# Patient Record
Sex: Female | Born: 1950 | Race: White | Hispanic: No | Marital: Married | Smoking: Never smoker
Health system: Southern US, Community
[De-identification: ages and names within clinical notes are randomized; demographics above are authoritative.]

## PROBLEM LIST (undated history)

## (undated) DIAGNOSIS — I2510 Atherosclerotic heart disease of native coronary artery without angina pectoris: Secondary | ICD-10-CM

## (undated) DIAGNOSIS — E785 Hyperlipidemia, unspecified: Secondary | ICD-10-CM

## (undated) DIAGNOSIS — I10 Essential (primary) hypertension: Secondary | ICD-10-CM

## (undated) DIAGNOSIS — R739 Hyperglycemia, unspecified: Secondary | ICD-10-CM

## (undated) DIAGNOSIS — K5792 Diverticulitis of intestine, part unspecified, without perforation or abscess without bleeding: Secondary | ICD-10-CM

## (undated) DIAGNOSIS — C50911 Malignant neoplasm of unspecified site of right female breast: Secondary | ICD-10-CM

## (undated) DIAGNOSIS — I214 Non-ST elevation (NSTEMI) myocardial infarction: Secondary | ICD-10-CM

## (undated) HISTORY — PX: LIVER BIOPSY: SHX301

## (undated) HISTORY — DX: Atherosclerotic heart disease of native coronary artery without angina pectoris: I25.10

## (undated) HISTORY — DX: Non-ST elevation (NSTEMI) myocardial infarction: I21.4

## (undated) HISTORY — PX: TONSILLECTOMY: SUR1361

---

## 1988-02-14 HISTORY — PX: BREAST BIOPSY: SHX20

## 1988-02-14 HISTORY — PX: BREAST RECONSTRUCTION: SHX9

## 1990-02-13 DIAGNOSIS — C50911 Malignant neoplasm of unspecified site of right female breast: Secondary | ICD-10-CM

## 1990-02-13 HISTORY — PX: RECONSTRUCTION BREAST W/ TRAM FLAP: SUR1079

## 1990-02-13 HISTORY — DX: Malignant neoplasm of unspecified site of right female breast: C50.911

## 1990-02-13 HISTORY — PX: MASTECTOMY: SHX3

## 2003-02-14 HISTORY — PX: TOTAL ABDOMINAL HYSTERECTOMY: SHX209

## 2012-12-14 DIAGNOSIS — I214 Non-ST elevation (NSTEMI) myocardial infarction: Secondary | ICD-10-CM

## 2012-12-14 HISTORY — DX: Non-ST elevation (NSTEMI) myocardial infarction: I21.4

## 2013-01-12 ENCOUNTER — Emergency Department (HOSPITAL_COMMUNITY): Payer: 59

## 2013-01-12 ENCOUNTER — Encounter (HOSPITAL_COMMUNITY)
Admission: EM | Disposition: A | Discharge status: Home / Self Care | Payer: Self-pay | Source: Home / Self Care | Attending: Cardiovascular Disease

## 2013-01-12 ENCOUNTER — Encounter (HOSPITAL_COMMUNITY): Payer: Self-pay | Admitting: Emergency Medicine

## 2013-01-12 ENCOUNTER — Inpatient Hospital Stay (HOSPITAL_COMMUNITY)
Admission: EM | Admit: 2013-01-12 | Discharge: 2013-01-15 | DRG: 247 | Disposition: A | Discharge status: Home / Self Care | Payer: 59 | Attending: Cardiovascular Disease | Admitting: Cardiovascular Disease

## 2013-01-12 DIAGNOSIS — E785 Hyperlipidemia, unspecified: Secondary | ICD-10-CM

## 2013-01-12 DIAGNOSIS — R7309 Other abnormal glucose: Secondary | ICD-10-CM | POA: Diagnosis present

## 2013-01-12 DIAGNOSIS — I129 Hypertensive chronic kidney disease with stage 1 through stage 4 chronic kidney disease, or unspecified chronic kidney disease: Secondary | ICD-10-CM | POA: Diagnosis present

## 2013-01-12 DIAGNOSIS — I2119 ST elevation (STEMI) myocardial infarction involving other coronary artery of inferior wall: Secondary | ICD-10-CM

## 2013-01-12 DIAGNOSIS — I249 Acute ischemic heart disease, unspecified: Secondary | ICD-10-CM

## 2013-01-12 DIAGNOSIS — Z955 Presence of coronary angioplasty implant and graft: Secondary | ICD-10-CM

## 2013-01-12 DIAGNOSIS — I2510 Atherosclerotic heart disease of native coronary artery without angina pectoris: Secondary | ICD-10-CM

## 2013-01-12 DIAGNOSIS — Z79899 Other long term (current) drug therapy: Secondary | ICD-10-CM

## 2013-01-12 DIAGNOSIS — I200 Unstable angina: Secondary | ICD-10-CM

## 2013-01-12 DIAGNOSIS — N189 Chronic kidney disease, unspecified: Secondary | ICD-10-CM | POA: Diagnosis present

## 2013-01-12 DIAGNOSIS — Z853 Personal history of malignant neoplasm of breast: Secondary | ICD-10-CM

## 2013-01-12 DIAGNOSIS — I214 Non-ST elevation (NSTEMI) myocardial infarction: Principal | ICD-10-CM

## 2013-01-12 HISTORY — DX: Essential (primary) hypertension: I10

## 2013-01-12 HISTORY — PX: LEFT HEART CATH: SHX5478

## 2013-01-12 HISTORY — PX: CORONARY ANGIOPLASTY WITH STENT PLACEMENT: SHX49

## 2013-01-12 HISTORY — PX: PERCUTANEOUS CORONARY STENT INTERVENTION (PCI-S): SHX5485

## 2013-01-12 HISTORY — DX: Hyperlipidemia, unspecified: E78.5

## 2013-01-12 LAB — PROTIME-INR: Prothrombin Time: 12.1 seconds (ref 11.6–15.2)

## 2013-01-12 LAB — HEPARIN LEVEL (UNFRACTIONATED): Heparin Unfractionated: <0.1 IU/mL — ABNORMAL LOW (ref 0.30–0.70)

## 2013-01-12 LAB — CBC
HCT: 45 % (ref 36.0–46.0)
Hemoglobin: 15.6 g/dL — ABNORMAL HIGH (ref 12.0–15.0)
MCHC: 34.7 g/dL (ref 30.0–36.0)
MCV: 90.4 fL (ref 78.0–100.0)
RBC: 4.98 MIL/uL (ref 3.87–5.11)
RDW: 12.3 % (ref 11.5–15.5)
WBC: 10.8 10*3/uL — ABNORMAL HIGH (ref 4.0–10.5)

## 2013-01-12 LAB — POCT I-STAT, CHEM 8
Glucose, Bld: 132 mg/dL — ABNORMAL HIGH (ref 70–99)
Hemoglobin: 16 g/dL — ABNORMAL HIGH (ref 12.0–15.0)
Sodium: 141 mEq/L (ref 135–145)
TCO2: 24 mmol/L (ref 0–100)

## 2013-01-12 LAB — APTT: aPTT: 35 seconds (ref 24–37)

## 2013-01-12 LAB — POCT I-STAT TROPONIN I: Troponin i, poc: 11.01 ng/mL (ref 0.00–0.08)

## 2013-01-12 LAB — TROPONIN I: Troponin I: 13.05 ng/mL (ref <0.30)

## 2013-01-12 SURGERY — LEFT HEART CATH
Anesthesia: LOCAL

## 2013-01-12 MED ORDER — ONDANSETRON HCL 4 MG/2ML IJ SOLN
4.0000 mg | Freq: Four times a day (QID) | INTRAMUSCULAR | Status: DC | PRN
  Administered 2013-01-12: 4 mg via INTRAVENOUS
  Filled 2013-01-12: qty 2

## 2013-01-12 MED ORDER — SODIUM CHLORIDE 0.9 % IV SOLN
INTRAVENOUS | Status: AC
  Administered 2013-01-12: 100 mL/h via INTRAVENOUS

## 2013-01-12 MED ORDER — ATORVASTATIN CALCIUM 80 MG PO TABS
80.0000 mg | ORAL_TABLET | Freq: Every day | ORAL | Status: DC
  Filled 2013-01-12 (×4): qty 1

## 2013-01-12 MED ORDER — NITROGLYCERIN 0.2 MG/ML ON CALL CATH LAB
INTRAVENOUS | Status: AC
  Filled 2013-01-12: qty 1

## 2013-01-12 MED ORDER — METOPROLOL TARTRATE 12.5 MG HALF TABLET
25.0000 mg | ORAL_TABLET | Freq: Two times a day (BID) | ORAL | Status: DC
  Administered 2013-01-12: 25 mg via ORAL
  Filled 2013-01-12: qty 2

## 2013-01-12 MED ORDER — HEPARIN (PORCINE) IN NACL 2-0.9 UNIT/ML-% IJ SOLN
INTRAMUSCULAR | Status: AC
  Filled 2013-01-12: qty 1000

## 2013-01-12 MED ORDER — METOPROLOL TARTRATE 25 MG PO TABS
25.0000 mg | ORAL_TABLET | Freq: Two times a day (BID) | ORAL | Status: DC
  Administered 2013-01-12 – 2013-01-15 (×6): 25 mg via ORAL
  Filled 2013-01-12 (×6): qty 1

## 2013-01-12 MED ORDER — MIDAZOLAM HCL 2 MG/2ML IJ SOLN
INTRAMUSCULAR | Status: AC
  Filled 2013-01-12: qty 2

## 2013-01-12 MED ORDER — TICAGRELOR 90 MG PO TABS
90.0000 mg | ORAL_TABLET | Freq: Two times a day (BID) | ORAL | Status: DC
  Administered 2013-01-12 – 2013-01-15 (×6): 90 mg via ORAL
  Filled 2013-01-12 (×7): qty 1

## 2013-01-12 MED ORDER — NITROGLYCERIN 0.4 MG SL SUBL: 0.4000 mg | SUBLINGUAL_TABLET | SUBLINGUAL | Status: DC | PRN

## 2013-01-12 MED ORDER — TICAGRELOR 90 MG PO TABS
ORAL_TABLET | ORAL | Status: AC
  Filled 2013-01-12: qty 1

## 2013-01-12 MED ORDER — FENTANYL CITRATE 0.05 MG/ML IJ SOLN
INTRAMUSCULAR | Status: AC
  Filled 2013-01-12: qty 2

## 2013-01-12 MED ORDER — HEPARIN (PORCINE) IN NACL 100-0.45 UNIT/ML-% IJ SOLN
800.0000 [IU]/h | INTRAMUSCULAR | Status: DC
  Administered 2013-01-12: 800 [IU]/h via INTRAVENOUS
  Filled 2013-01-12: qty 250

## 2013-01-12 MED ORDER — HEPARIN SODIUM (PORCINE) 5000 UNIT/ML IJ SOLN: 60.0000 [IU]/kg | Freq: Once | INTRAMUSCULAR | Status: DC

## 2013-01-12 MED ORDER — LIDOCAINE HCL (PF) 1 % IJ SOLN
INTRAMUSCULAR | Status: AC
  Filled 2013-01-12: qty 30

## 2013-01-12 MED ORDER — VERAPAMIL HCL 2.5 MG/ML IV SOLN
INTRAVENOUS | Status: AC
  Filled 2013-01-12: qty 2

## 2013-01-12 MED ORDER — SODIUM CHLORIDE 0.9 % IV SOLN
0.2500 mg/kg/h | INTRAVENOUS | Status: AC
  Filled 2013-01-12 (×3): qty 250

## 2013-01-12 MED ORDER — HEPARIN (PORCINE) IN NACL 2-0.9 UNIT/ML-% IJ SOLN
INTRAMUSCULAR | Status: AC
  Filled 2013-01-12: qty 500

## 2013-01-12 MED ORDER — ACETAMINOPHEN 325 MG PO TABS
650.0000 mg | ORAL_TABLET | ORAL | Status: DC | PRN
  Administered 2013-01-13: 650 mg via ORAL
  Filled 2013-01-12: qty 2

## 2013-01-12 MED ORDER — ASPIRIN 81 MG PO CHEW
324.0000 mg | CHEWABLE_TABLET | Freq: Once | ORAL | Status: AC
  Administered 2013-01-12: 324 mg via ORAL
  Filled 2013-01-12: qty 4

## 2013-01-12 MED ORDER — BIVALIRUDIN 250 MG IV SOLR
INTRAVENOUS | Status: AC
  Filled 2013-01-12: qty 250

## 2013-01-12 MED ORDER — HEPARIN BOLUS VIA INFUSION
4000.0000 [IU] | Freq: Once | INTRAVENOUS | Status: AC
  Administered 2013-01-12: 4000 [IU] via INTRAVENOUS
  Filled 2013-01-12: qty 4000

## 2013-01-12 MED ORDER — ASPIRIN 81 MG PO CHEW
81.0000 mg | CHEWABLE_TABLET | Freq: Every day | ORAL | Status: DC
  Administered 2013-01-13 – 2013-01-15 (×3): 81 mg via ORAL
  Filled 2013-01-12 (×3): qty 1

## 2013-01-12 MED ORDER — HEPARIN (PORCINE) IN NACL 100-0.45 UNIT/ML-% IJ SOLN: 12.0000 [IU]/kg/h | INTRAMUSCULAR | Status: DC

## 2013-01-12 MED ORDER — PROMETHAZINE HCL 25 MG/ML IJ SOLN
12.5000 mg | Freq: Three times a day (TID) | INTRAMUSCULAR | Status: DC | PRN
  Administered 2013-01-12: 12.5 mg via INTRAVENOUS
  Filled 2013-01-12 (×2): qty 1

## 2013-01-12 MED ORDER — ASPIRIN 81 MG PO CHEW
CHEWABLE_TABLET | ORAL | Status: AC
  Filled 2013-01-12: qty 3

## 2013-01-12 NOTE — ED Notes (Signed)
Dr Radford Pax made aware of troponin of 9.87

## 2013-01-12 NOTE — Progress Notes (Signed)
Chaplain gave care to pt's husband while pt was in cath lab.  Chaplain offered gifts of compassion and exercised empathic listening and, also, provided a beverage from [facility].     01/12/13 1600  Clinical Encounter Type  Visited With Family  Visit Type Spiritual support    Rulon Abide [phone]

## 2013-01-12 NOTE — H&P (Signed)
Physician History and Physical         Patient ID: Ashley Conley MRN: [id number] DOB/AGE: 1951/02/10 62 y.o. Admit date: 01/12/2013  Primary Care Physician: No primary provider on file.  [facility] NP Primary Cardiologist:  New/Nishan  Active Problems:   * No active hospital problems. *   HPI:   62 yo with no cardiac history. 3 weeks of intermitant chest pressure and malainse.  Last night had severe episode with nausea and vohmiting  Thought it was reflux.  Came to ER this  A.m.  Called at 10:00 a.m by [facility] ED  Concern for acute ST elevaton MI with .5mm ST segment elevation in 3,F  Cath team called in by 10:15 am  She was given ASA and heparin bolus and  Drip Still with mild pressure.  No hemodynamic instability. No contraindications to anticoagulation.  CRF HTN on ARB  Nonsmoker nondiabetic  Tropoinin by time I left ER came back 11  Review of systems complete and found to be negative unless listed above   Past Medical History  Diagnosis Date  . Breast cancer     History reviewed. No pertinent family history.  History   Social History  . Marital Status: Married    Spouse Name: N/A    Number of Children: N/A  . Years of Education: N/A   Occupational History  . Not on file.   Social History Main Topics  . Smoking status: Never Smoker   . Smokeless tobacco: Not on file  . Alcohol Use: No  . Drug Use: No  . Sexual Activity: Not on file   Other Topics Concern  . Not on file   Social History Narrative  . No narrative on file    Past Surgical History  Procedure Laterality Date  . Abdominal hysterectomy    . Reconstruction breast w/ tram flap      1992     Prescriptions prior to admission  Medication Sig Dispense Refill  . Coenzyme Q10 (CO Q 10 PO) Take 1 capsule by mouth daily.      Marland Kitchen dicyclomine (BENTYL) 10 MG capsule Take 10 mg by mouth daily.      Marland Kitchen losartan (COZAAR) 25 MG tablet Take 25 mg by mouth daily.         Physical Exam: Blood pressure 135/74,  pulse 91, resp. rate 16, height 5\' 2"  (1.575 m), weight 153 lb (69.4 kg), SpO2 96.00%.  Affect appropriate Healthy:  appears stated age HEENT: normal Neck supple with no adenopathy JVP normal no bruits no thyromegaly Lungs clear with no wheezing and good diaphragmatic motion Heart:  S1/S2 no murmur, no rub, gallop or click  S/P bilateral breast reconstruction PMI normal Abdomen: benighn, BS positve, no tenderness, no AAA S/P hysterectomy no bruit.  No HSM or HJR Distal pulses intact with no bruits No edema Neuro non-focal Skin warm and dry No muscular weakness   Labs:   Lab Results  Component Value Date   WBC 10.8* 01/12/2013   HGB 16.0* 01/12/2013   HCT 47.0* 01/12/2013   MCV 90.4 01/12/2013   PLT 317 01/12/2013    Recent Labs Lab 01/12/13 0927  NA 141  K 4.2  CL 104  BUN 11  CREATININE 0.90  GLUCOSE 132*   Lab Results  Component Value Date   TROPONINI 13.05* 01/12/2013       Radiology: Dg Chest 2 View  01/12/2013   CLINICAL DATA:  Chest pain  EXAM: CHEST - 2 VIEW  COMPARISON:  None available  FINDINGS: Surgical clips in the right axilla, upper abdomen, and anterior chest wall. Lungs clear. Heart size normal. No effusion. Regional bones unremarkable.  IMPRESSION: No acute cardiopulmonary disease.   Electronically Signed   By: Oley Balm M.D.   On: 01/12/2013 09:43    EKG:  SR ST elevation in 3,F no reciprocal changes  ASSESSMENT AND PLAN:   MI:  Cath team called in Given ASA and heparin. Dr Kirke Corin called.  Risks and benefits of cath discussed with patient and husband She agrees to proceed and consent signed in [facility] ED HTN:  On ARB continue unless she has hemodynamic instability with reperfusion Chol:  Check fasting lipids start statin  Breast Cancer: over 20 years ago no recurrence stable Signed: Theron Arista Nishan11/30/2014, 10:55 AM

## 2013-01-12 NOTE — ED Notes (Signed)
Dr Radford Pax notified of repeat troponin of 11.01

## 2013-01-12 NOTE — ED Notes (Signed)
Pt reports having difficulty eating and feeling like "food is not going down" for several weeks, has had 4 instances. Instance last night started, pain has been more severe this time. Central chest pain 4/10 at present. Vomiting around 0400. Denies smoking hx.

## 2013-01-12 NOTE — CV Procedure (Signed)
Cardiac Catheterization Procedure Note  Name: Ashley Conley MRN: [id number] DOB: 04/16/1950  Procedure: Left Heart Cath, Selective Coronary Angiography, LV angiography, PTCA and stenting of the mid left circumflex artery  Indication: Non-ST elevation myocardial infarction with ongoing chest pain. This was an urgent cardiac catheterization. EKG did not meet criteria for ST elevation MI.  Medications:  Sedation:  2 mg IV Versed, 50 mcg IV Fentanyl  Contrast:  135 ml Omnipaque  Procedural Details: The right wrist was prepped, draped, and anesthetized with 1% lidocaine. Using the modified Seldinger technique, a 5 French Slender sheath was introduced into the right radial artery. 3 mg of verapamil was administered through the sheath, weight-based unfractionated heparin was administered intravenously. A JR 4 catheter was used for right coronary artery angiography. An XB 3.0 with side holes guiding catheter was used for left circumflex PCI.  A pigtail catheter was used for left ventriculography. Catheter exchanges were performed over an exchange length guidewire. There were no immediate procedural complications.  Procedural Findings:  Hemodynamics: AO:  106/68   mmHg LV:  104/8    mmHg LVEDP: 10  mmHg  Coronary angiography: Coronary dominance: Right   Left Main:  Normal in size with no significant disease.  Left Anterior Descending (LAD):  The LAD is normal in size but of small-caliber in general. There are minor irregularities proximally. In the mid to distal segment there is an 80% stenosis which is after the second diagonal.  1st diagonal (D1):  Small in size with minor irregularities.  2nd diagonal (D2):  Small in size with minor irregularities.  3rd diagonal (D3):  Small in size with minor irregularities.  Circumflex (LCx):  Normal in size and nondominant. There is a 99% hazy stenosis in the midsegment at the origin of OM 2 which is likely the culprit for presentation.  1st obtuse  marginal:  Very small in size with no significant disease.  2nd obtuse marginal:  Small in size with 90% ostial stenosis.  3rd obtuse marginal:  Normal in size with no significant disease.   Right Coronary Artery: Normal in size and dominant. There is a 40-50% tubular stenosis in the midsegment. There is 40-50% tubular stenosis in the distal segment.  Posterior descending artery: Normal in size with minor irregularities.  Posterior AV segment: Normal in size with minor irregularities.  Posterolateral branchs:  Normal in size with no significant disease.  Left ventriculography: Left ventricular systolic function is normal , LVEF is estimated at 55 %, there is no significant mitral regurgitation   PCI Note:  Following the diagnostic procedure, the decision was made to proceed with PCI.  Weight-based bivalirudin was given for anticoagulation. Once a therapeutic ACT was achieved, a 6 [place] XB 3.0 guide catheter with sideholes was inserted. I initially used along with no side holes but there was significant pressure dampening.  A intuition coronary guidewire was used to cross the lesion.  The lesion was predilated with a 2.0 x 12 balloon.  The lesion was then stented with a 2.25 x 15 mm Xience expedition drug-eluting stent.  The stent was postdilated with a 2.5 x 12 noncompliant balloon.  Following PCI, there was 0% residual stenosis and TIMI-3 flow. Final angiography confirmed an excellent result. However, OM 2 was a jailed by the stent with loss of flow after placing a stent. The patient had no chest discomfort or EKG changes. Given that the branch overall of small size and about 1.5 mm in diameter, I elected not to rescue  this. The patient tolerated the procedure well. A TR band was used for radial hemostasis. The patient was transferred to the post catheterization recovery area for further monitoring.  PCI Data: Vessel - left circumflex/Segment - mid Percent Stenosis (pre)  99% TIMI-flow  3 Stent : 2.25 x 15 mm Xience expedition drug-eluting stent postdilated with a 2.5 noncompliant balloon Percent Stenosis (post) 0% TIMI-flow (post) 3   Final Conclusions:  1. Significant 2 vessel coronary artery disease. The culprit for presentation as 99% stenosis in the mid left circumflex at the bifurcation of a small size OM 2. 2. Normal LV systolic function and left ventricular end-diastolic pressure. 3. Successful angioplasty and drug-eluting stent placement to the mid left circumflex. The small OM 2 was a jailed by the stent with loss of flow. This was not rescued given that it is 1.5 mm in diameter and the patient had no chest pain or EKG changes.  Recommendations:  Dual antiplatelet therapy for at least 12 months. I will continue small dose bivalirudin for 4 hours. LAD PCI can be considered during this hospitalization if the patient has angina or in the future for anginal symptoms/abnormal stress test. The patient overall has small coronary arteries with diffuse disease. Aggressive treatment of risk factors is recommended.    Lorine Bears MD, [facility] 01/12/2013, 11:57 AM

## 2013-01-12 NOTE — ED Provider Notes (Signed)
CSN: [id number]     Arrival date & time 01/12/13  4259 History   First MD Initiated Contact with Patient 01/12/13 [phone]     Chief Complaint  Patient presents with  . Chest Pain  . esophagus problems     HPI Patient began having midsternal chest discomfort last night while eating stirfry vegetables.  Patient stated that he felt like the food would not go down.  Pain continued throughout the night until she vomited around 4 AM.  She felt better after that.  She still has midsternal chest discomfort.  Patient has no history of acute coronary syndrome or smoking history.  She has had difficulty swallowing over the last 2 years but has never been worked up.  Patient does feel like liquids for cone down now. Past Medical History  Diagnosis Date  . Breast cancer    Past Surgical History  Procedure Laterality Date  . Abdominal hysterectomy    . Reconstruction breast w/ tram flap      1992   History reviewed. No pertinent family history. History  Substance Use Topics  . Smoking status: Never Smoker   . Smokeless tobacco: Not on file  . Alcohol Use: No   OB History   Grav Para Term Preterm Abortions TAB SAB Ect Mult Living                 Review of Systems All other systems reviewed and are negative Allergies  Advil and Sulfa antibiotics  Home Medications   Current Outpatient Rx  Name  Route  Sig  Dispense  Refill  . Coenzyme Q10 (CO Q 10 PO)   Oral   Take 1 capsule by mouth daily.         Marland Kitchen dicyclomine (BENTYL) 10 MG capsule   Oral   Take 10 mg by mouth daily.         Marland Kitchen losartan (COZAAR) 25 MG tablet   Oral   Take 25 mg by mouth daily.           BP 135/74  Pulse 91  Resp 16  Ht 5\' 2"  (1.575 m)  Wt 153 lb (69.4 kg)  BMI 27.98 kg/m2  SpO2 96% Physical Exam  Nursing note and vitals reviewed. Constitutional: She is oriented to person, place, and time. She appears well-developed and well-nourished. No distress.  HENT:  Head: Normocephalic and atraumatic.   Eyes: Pupils are equal, round, and reactive to light.  Neck: Normal range of motion.  Cardiovascular: Normal rate, normal heart sounds and intact distal pulses.   Pulmonary/Chest: No respiratory distress.  Abdominal: Normal appearance. She exhibits no distension. There is no tenderness.  Musculoskeletal: Normal range of motion.  Neurological: She is alert and oriented to person, place, and time. No cranial nerve deficit.  Skin: Skin is warm and dry. No rash noted.  Psychiatric: She has a normal mood and affect. Her behavior is normal.    ED Course  Procedures (including critical care time)  Medications  nitroGLYCERIN (NITROSTAT) SL tablet 0.4 mg (not administered)  heparin ADULT infusion 100 units/mL (25000 units/250 mL) (800 Units/hr Intravenous New Bag/Given 01/12/13 1032)  aspirin chewable tablet 324 mg (324 mg Oral Given 01/12/13 1008)  heparin bolus via infusion 4,000 Units (4,000 Units Intravenous New Bag/Given 01/12/13 1032)  aspirin 81 MG chewable tablet (  Given 01/12/13 1031)    Labs Review Labs Reviewed  TROPONIN I - Abnormal; Notable for the following:    Troponin I 13.05 (*)  All other components within normal limits  CBC - Abnormal; Notable for the following:    WBC 10.8 (*)    Hemoglobin 15.6 (*)    All other components within normal limits  POCT I-STAT, CHEM 8 - Abnormal; Notable for the following:    Glucose, Bld 132 (*)    Hemoglobin 16.0 (*)    HCT 47.0 (*)    All other components within normal limits  POCT I-STAT TROPONIN I - Abnormal; Notable for the following:    Troponin i, poc 11.01 (*)    All other components within normal limits  APTT  PROTIME-INR  HEPARIN LEVEL (UNFRACTIONATED)   I discussed with cardiology who recommended patient be transferred to the catheter lab at [facility].   CRITICAL CARE Performed by: Nelia Shi Total critical care time: 45 min Critical care time was exclusive of separately billable procedures and  treating other patients. Critical care was necessary to treat or prevent imminent or life-threatening deterioration. Critical care was time spent personally by me on the following activities: development of treatment plan with patient and/or surrogate as well as nursing, discussions with consultants, evaluation of patient's response to treatment, examination of patient, obtaining history from patient or surrogate, ordering and performing treatments and interventions, ordering and review of laboratory studies, ordering and review of radiographic studies, pulse oximetry and re-evaluation of patient's condition.  Imaging Review Dg Chest 2 View  01/12/2013   CLINICAL DATA:  Chest pain  EXAM: CHEST - 2 VIEW  COMPARISON:  None available  FINDINGS: Surgical clips in the right axilla, upper abdomen, and anterior chest wall. Lungs clear. Heart size normal. No effusion. Regional bones unremarkable.  IMPRESSION: No acute cardiopulmonary disease.   Electronically Signed   By: Oley Balm M.D.   On: 01/12/2013 09:43    EKG Interpretation    Date/Time:  Sunday January 12 2013 09:12:14 EST Ventricular Rate:  76 PR Interval:  139 QRS Duration: 101 QT Interval:  396 QTC Calculation: 445 R Axis:   87 Text Interpretation:  Sinus rhythm Borderline right axis deviation Consider inferior infarct Baseline wander in lead(s) II V3 Confirmed by Lenoard Helbert  MD, Karron Alvizo ([id number]) on 01/12/2013 9:15:20 AM            MDM   1. ACS (acute coronary syndrome)        Nelia Shi, MD 01/12/13 1034

## 2013-01-12 NOTE — Progress Notes (Addendum)
ANTICOAGULATION CONSULT NOTE - Initial Consult  Pharmacy Consult for Heparin Indication: chest pain/ACS  Allergies  Allergen Reactions  . Advil [Ibuprofen] Itching  . Sulfa Antibiotics Nausea And Vomiting    Patient Measurements: Height: 5\' 2"  (157.5 cm) Weight: 153 lb (69.4 kg) IBW/kg (Calculated) : 50.1 Heparin Dosing Weight: 65 kg  Vital Signs: BP: 135/74 mmHg (11/30 0908) Pulse Rate: 91 (11/30 0908)  Labs:  Recent Labs  01/12/13 0927  HGB 16.0*  HCT 47.0*  CREATININE 0.90    Estimated Creatinine Clearance: 59.1 ml/min (by C-G formula based on Cr of 0.9).   Medical History: Past Medical History  Diagnosis Date  . Breast cancer      Assessment: 51 yoF presents with chest pain and elevated troponins.  Beginning IV Heparin for ACS.  Baseline CBC, PT/INR, APTT wnl  SCr 0.90, CrCl~59 ml/min  Goal of Therapy:  Heparin level 0.3-0.7 units/ml Monitor platelets by anticoagulation protocol: Yes   Plan:   Heparin 4000 unit IV bolus x 1 then start infusion at 800 units/hr (8 mL/hr).  Check heparin level at 1630 (~6 hours after start of infusion).  Daily HL and CBC while on heparin.  Clance Boll 01/12/2013,10:08 AM

## 2013-01-12 NOTE — Progress Notes (Signed)
Utilization Review Completed.Carman Essick T11/30/2014  

## 2013-01-13 DIAGNOSIS — I214 Non-ST elevation (NSTEMI) myocardial infarction: Secondary | ICD-10-CM

## 2013-01-13 LAB — CBC
HCT: 36.2 % (ref 36.0–46.0)
Hemoglobin: 12.6 g/dL (ref 12.0–15.0)
MCH: 31.5 pg (ref 26.0–34.0)
Platelets: 257 10*3/uL (ref 150–400)
RBC: 4 MIL/uL (ref 3.87–5.11)
WBC: 10.4 10*3/uL (ref 4.0–10.5)

## 2013-01-13 LAB — POCT ACTIVATED CLOTTING TIME: Activated Clotting Time: 826 seconds

## 2013-01-13 LAB — BASIC METABOLIC PANEL
CO2: 26 mEq/L (ref 19–32)
Calcium: 8.7 mg/dL (ref 8.4–10.5)
Chloride: 107 mEq/L (ref 96–112)
Glucose, Bld: 112 mg/dL — ABNORMAL HIGH (ref 70–99)
Sodium: 142 mEq/L (ref 135–145)

## 2013-01-13 MED FILL — Sodium Chloride IV Soln 0.9%: INTRAVENOUS | Qty: 50 | Status: AC

## 2013-01-13 NOTE — Care Management Note (Addendum)
    Page 1 of 1   01/13/2013     2:17:23 PM   CARE MANAGEMENT NOTE 01/13/2013  Patient:  Ashley Conley   Account Number:  [id number]  Date Initiated:  01/13/2013  Documentation initiated by:  Junius Creamer  Subjective/Objective Assessment:   adm w mi     Action/Plan:   lives w husband   Anticipated DC Date:     Anticipated DC Plan:        DC Planning Services  CM consult  Medication Assistance      Choice offered to / List presented to:             Status of service:  Completed, signed off Medicare Important Message given?   (If response is "NO", the following Medicare IM given date fields will be blank) Date Medicare IM given:   Date Additional Medicare IM given:    Discharge Disposition:  HOME/SELF CARE  Per UR Regulation:  Reviewed for med. necessity/level of care/duration of stay  If discussed at Long Length of Stay Meetings, dates discussed:    Comments:   01-13-13 [address] Tomi Bamberger, RN,BSN [phone] Copay $20.00/30 day supply - no prior Berkley Harvey is required. Pt uses [facility] in [place]. CM did call and medication is available.   12/1 0947 debbie dowell rn,bsn gave pt brilinta 30day free and copay assist card.

## 2013-01-13 NOTE — Progress Notes (Signed)
CARDIAC REHAB PHASE I   PRE:  Rate/Rhythm: 88SR  BP:  Supine:   Sitting: 140/87  Standing:    SaO2:   MODE:  Ambulation: 700 ft   POST:  Rate/Rhythm: 93SR  BP:  Supine:   Sitting: 136/83  Standing:    SaO2:  1015-1115 Pt walked 700 ft with steady gait. No CP. Tolerated well. Education completed with pt and family. Understanding voiced. Discussed CRP 2 and permission given to refer to [facility] program. Pt has brillinta packet and stent booklet.    Luetta Nutting, RN BSN  01/13/2013 11:11 AM

## 2013-01-13 NOTE — Progress Notes (Signed)
Chaplain followed up with pt from previous on call chaplain.  Pt had already been seen by their own pastor and had their own spiritual resources and family support.  It appears that the pt has the spiritual and emotional support needed right now.  However, if that changes in the future, chaplain services will be available.

## 2013-01-13 NOTE — Progress Notes (Signed)
      PROGRESS NOTE  Subjective:   62 yo with presentation of UAP - / NSTEMI - now s/p PCI of OM.   Objective:    Vital Signs:   Temp:  [97.6 F (36.4 C)-98.4 F (36.9 C)] 98.2 F (36.8 C) (12/01 0352) Pulse Rate:  [64-91] 72 (12/01 0100) Resp:  [10-20] 13 (12/01 0100) BP: (110-149)/(51-93) 144/63 mmHg (12/01 0100) SpO2:  [91 %-99 %] 91 % (12/01 0100) Weight:  [153 lb (69.4 kg)-158 lb 8.2 oz (71.9 kg)] 158 lb 8.2 oz (71.9 kg) (11/30 1229)      24-hour weight change: Weight change:   Weight trends: Filed Weights   01/12/13 1005 01/12/13 1229  Weight: 153 lb (69.4 kg) 158 lb 8.2 oz (71.9 kg)    Intake/Output:  11/30 0701 - 12/01 0700 In: 938.3 [P.O.:240; I.V.:698.3] Out: 1300 [Urine:1300]     Physical Exam: BP 144/63  Pulse 72  Temp(Src) 98.2 F (36.8 C) (Oral)  Resp 13  Ht 5\' 2"  (1.575 m)  Wt 158 lb 8.2 oz (71.9 kg)  BMI 28.98 kg/m2  SpO2 91%  General: Vital signs reviewed and noted.   Head: Normocephalic, atraumatic.  Eyes: conjunctivae/corneas clear.  EOM's intact.   Throat: normal  Neck:  normal  Lungs:    clear  Heart:  RR  Abdomen:  Soft, non-tender, non-distended    Extremities: Right  Wrist cath site  is normal    Neurologic: A&O X3, CN II - XII are grossly intact.   Psych: Normal     Labs: BMET:  Recent Labs  01/12/13 0927 01/13/13 0455  NA 141 142  K 4.2 4.0  CL 104 107  CO2  --  26  GLUCOSE 132* 112*  BUN 11 10  CREATININE 0.90 0.73  CALCIUM  --  8.7    Liver function tests: No results found for this basename: AST, ALT, ALKPHOS, BILITOT, PROT, ALBUMIN,  in the last 72 hours No results found for this basename: LIPASE, AMYLASE,  in the last 72 hours  CBC:  Recent Labs  01/12/13 0920 01/12/13 0927 01/13/13 0455  WBC 10.8*  --  10.4  HGB 15.6* 16.0* 12.6  HCT 45.0 47.0* 36.2  MCV 90.4  --  90.5  PLT 317  --  257    Cardiac Enzymes:  Recent Labs  01/12/13 0920  TROPONINI 13.05*    Coagulation  Studies:  Recent Labs  01/12/13 0920  LABPROT 12.1  INR 0.91      Other results:  EKG - 12/1   NSR, no ST or T wave changes   Medications:    Infusions:    Scheduled Medications: . aspirin  81 mg Oral Daily  . atorvastatin  80 mg Oral q1800  . metoprolol tartrate  25 mg Oral BID  . Ticagrelor  90 mg Oral BID    Assessment/ Plan:   1. CAD - s/p PCI to her LCx.   Pain free .  She had significant liver abnormalities with a statin ~10 years ago - required a liver Bx.  We will need to be cautious about her statin therapy.   Will follow LFTs carefully.   Transfer to tele:   Disposition: ambulate, home in several days.  Length of Stay: 1  Vesta Mixer, Montez Hageman., MD, [facility] 01/13/2013, 7:35 AM Office [phone] Pager [phone]

## 2013-01-14 DIAGNOSIS — E785 Hyperlipidemia, unspecified: Secondary | ICD-10-CM

## 2013-01-14 MED ORDER — EZETIMIBE 10 MG PO TABS
10.0000 mg | ORAL_TABLET | Freq: Every day | ORAL | Status: DC
  Administered 2013-01-14 – 2013-01-15 (×2): 10 mg via ORAL
  Filled 2013-01-14 (×2): qty 1

## 2013-01-14 NOTE — Progress Notes (Signed)
CARDIAC REHAB PHASE I   PRE:  Rate/Rhythm: 67SR  BP:  Supine: 100/60  Sitting:   Standing:    SaO2:   MODE:  Ambulation: 1100 ft   POST:  Rate/Rhythm: 77  BP:  Supine:   Sitting: 124/80  Standing:    SaO2: 97%RA 1100-1124 Pt walked 1100 ft with steady gait. Tolerated well. Can walk independently or with husband. Knows to rest up 3 hours between walks. No CP.    Luetta Nutting, RN BSN  01/14/2013 11:20 AM

## 2013-01-14 NOTE — Progress Notes (Signed)
      PROGRESS NOTE  Subjective:   62 yo with presentation of UAP - / NSTEMI - now s/p PCI of OM.  Transferred to 3W yesterday.  Ambulating without problems.   Objective:    Vital Signs:   Temp:  [97 F (36.1 C)-98.2 F (36.8 C)] 97 F (36.1 C) (12/02 0517) Pulse Rate:  [75-82] 75 (12/02 0517) Resp:  [18] 18 (12/02 0517) BP: (94-129)/(63-84) 94/63 mmHg (12/02 0517) SpO2:  [95 %-97 %] 97 % (12/02 0517) Weight:  [153 lb (69.4 kg)] 153 lb (69.4 kg) (12/01 1146)      24-hour weight change: Weight change: 0 lb (0 kg)  Weight trends: Filed Weights   01/12/13 1005 01/12/13 1229 01/13/13 1146  Weight: 153 lb (69.4 kg) 158 lb 8.2 oz (71.9 kg) 153 lb (69.4 kg)    Intake/Output:  12/01 0701 - 12/02 0700 In: 710 [P.O.:710] Out: -      Physical Exam: BP 94/63  Pulse 75  Temp(Src) 97 F (36.1 C) (Oral)  Resp 18  Ht 5\' 2"  (1.575 m)  Wt 153 lb (69.4 kg)  BMI 27.98 kg/m2  SpO2 97%  General: Vital signs reviewed and noted.   Head: Normocephalic, atraumatic.  Eyes: conjunctivae/corneas clear.  EOM's intact.   Throat: normal  Neck:  normal  Lungs:    clear  Heart:  RR  Abdomen:  Soft, non-tender, non-distended    Extremities: Right  Wrist cath site  is normal    Neurologic: A&O X3, CN II - XII are grossly intact.   Psych: Normal     Labs: BMET:  Recent Labs  01/12/13 0927 01/13/13 0455  NA 141 142  K 4.2 4.0  CL 104 107  CO2  --  26  GLUCOSE 132* 112*  BUN 11 10  CREATININE 0.90 0.73  CALCIUM  --  8.7    Liver function tests: No results found for this basename: AST, ALT, ALKPHOS, BILITOT, PROT, ALBUMIN,  in the last 72 hours No results found for this basename: LIPASE, AMYLASE,  in the last 72 hours  CBC:  Recent Labs  01/12/13 0920 01/12/13 0927 01/13/13 0455  WBC 10.8*  --  10.4  HGB 15.6* 16.0* 12.6  HCT 45.0 47.0* 36.2  MCV 90.4  --  90.5  PLT 317  --  257    Cardiac Enzymes:  Recent Labs  01/12/13 0920  TROPONINI 13.05*     Coagulation Studies:  Recent Labs  01/12/13 0920  LABPROT 12.1  INR 0.91      Other results:  EKG - 12/1   NSR, no ST or T wave changes   Medications:    Infusions:    Scheduled Medications: . aspirin  81 mg Oral Daily  . atorvastatin  80 mg Oral q1800  . metoprolol tartrate  25 mg Oral BID  . Ticagrelor  90 mg Oral BID    Assessment/ Plan:   1. CAD - s/p PCI to her LCx.   Pain free .  She had significant liver abnormalities with Atorvastatin - She will quite sick for a long time - required a liver bx.  Will DC the Atorvastatin and start Zetia.    Ambulate today.  Anticipate DC tomorrow    Disposition:    Length of Stay: 2  Vesta Mixer, Montez Hageman., MD, [facility] 01/14/2013, 8:09 AM Office [phone] Pager [phone]

## 2013-01-15 ENCOUNTER — Other Ambulatory Visit: Payer: Self-pay | Admitting: Physician Assistant

## 2013-01-15 ENCOUNTER — Encounter (HOSPITAL_COMMUNITY): Payer: Self-pay | Admitting: Physician Assistant

## 2013-01-15 DIAGNOSIS — I2510 Atherosclerotic heart disease of native coronary artery without angina pectoris: Secondary | ICD-10-CM

## 2013-01-15 MED ORDER — TICAGRELOR 90 MG PO TABS: 90.0000 mg | ORAL_TABLET | Freq: Two times a day (BID) | ORAL | Status: DC

## 2013-01-15 MED ORDER — METOPROLOL TARTRATE 25 MG PO TABS: 25.0000 mg | ORAL_TABLET | Freq: Two times a day (BID) | ORAL | Status: DC

## 2013-01-15 MED ORDER — EZETIMIBE 10 MG PO TABS: 10.0000 mg | ORAL_TABLET | Freq: Every day | ORAL | Status: DC

## 2013-01-15 MED ORDER — NITROGLYCERIN 0.4 MG SL SUBL: 0.4000 mg | SUBLINGUAL_TABLET | SUBLINGUAL | Status: DC | PRN

## 2013-01-15 MED ORDER — ASPIRIN 81 MG PO TABS: 81.0000 mg | ORAL_TABLET | Freq: Every day | ORAL | Status: DC

## 2013-01-15 NOTE — Plan of Care (Signed)
Problem: Food- and Nutrition-Related Knowledge Deficit (NB-1.1) Goal: Nutrition education Formal process to instruct or train a patient/client in a skill or to impart knowledge to help patients/clients voluntarily manage or modify food choices and eating behavior to maintain or improve health.  Outcome: Completed/Met Date Met:  01/15/13 Nutrition Education Note  RD consulted for nutrition education regarding a Heart Healthy diet. Patient says that her triglycerides have been elevated in the past. Patient reports an allergy to dairy products (cow's milk, cheese, yogurt, and butter). She has chickens and eats ~6 eggs per week. She prepares meals at home due to her dairy allergy. From dietary recall, patient's usual diet is fairly healthy. We determined a few small changes she can make to increase fiber in her diet and reduce cholesterol intake.   Lipid Panel  No results found for this basename: chol, trig, hdl, cholhdl, vldl, ldlcalc    RD provided "Heart Healthy Nutrition Therapy" handout from the Academy of [employer]. Reviewed patient's dietary recall. Provided examples on ways to decrease sodium and fat intake in diet. Discouraged intake of processed foods and use of salt shaker. Encouraged fresh fruits and vegetables as well as whole grain sources of carbohydrates to maximize fiber intake. Teach back method used.  Expect good compliance.  Body mass index is 27.98 kg/(m^2). Pt meets criteria for overweight based on current BMI.  Current diet order is heart healthy, patient is consuming approximately >75% of meals at this time. Labs and medications reviewed. No further nutrition interventions warranted at this time. RD contact information provided. If additional nutrition issues arise, please re-consult RD.  Joaquin Courts, RD, LDN, CNSC Pager [phone] After Hours Pager [phone]

## 2013-01-15 NOTE — Discharge Summary (Signed)
CARDIOLOGY DISCHARGE SUMMARY   Patient ID: Ashley Conley MRN: [id number] DOB/AGE: Sep 09, 1950 62 y.o.  Admit date: 01/12/2013 Discharge date: 01/15/2013  PCP: [facility] NP Primary Cardiologist: Eden Emms  Primary Discharge Diagnosis:   NSTEMI (non-ST elevated myocardial infarction) -  2.25 x 15 mm Xience expedition drug-eluting stent to the circumflex; medical therapy for 80% LAD   Secondary Discharge Diagnosis:  .  hyperglycemia    . Hypertension   . Hyperlipidemia     Statins caused to elevated LFTs   Procedures:  Left Heart Cath, Selective Coronary Angiography, LV angiography, PTCA and stenting of the mid left circumflex artery   Hospital Course: Ashley Conley is a 62 y.o. female with no history of CAD. She came to the emergency room on the day of admission with chest pain that started the night before. Her ECG was borderline for STEMI. However, her cardiac enzymes were negative and she was having ongoing discomfort. The cath lab team was called in and she was taken from [facility] ER to the Cath Lab at [facility].  Cardiac catheterization results are below. She had a stent to the circumflex artery and tolerated the procedure well. The circumflex was hazy with a 99% stenosis and felt to be the culprit vessel. However, the LAD had an 80% stenosis after the second diagonal. The OM 2 was jailed by the stent. It had a 90% ostial stenosis and after the stent was placed, no flow was seen. However, the splenic had no chest pain and no ECG changes associated with this. The vessel was less than 2 mm in diameter and was not rescued.  She had previously been on a statin, but it was discontinued secondary to abnormal LFTs. A liver biopsy had previously been performed but was okay. She was started on Zetia and will have a lipid profile and LFTs checked as an outpatient.  She was started on a beta blocker but her blood pressure was not high enough to tolerate both a beta blocker and her previous  home medication of losartan so it is currently on hold.  She was seen by cardiac rehabilitation and educated on MI restrictions, exercise guidelines and heart-healthy lifestyle changes. She will follow up with cardiac rehabilitation as an outpatient.  Because of her 80% LAD, consideration was given to performing PCI prior to discharge. However, she was able to increase her ambulation without chest pain or shortness of breath and continued to steadily improve. The decision was made to manage her medically and obtain a stress Myoview as an outpatient for further assessment.  On 01/15/2013, she was seen by Dr. Eden Emms and a dietitian. She has hyperglycemia and is borderline diabetic. She also does not eat dairy products. She was seen by dietitian and given information on how to improve her diet.  Dr. Eden Emms evaluated Ashley Conley and reviewed all data. He did not feel any further inpatient workup was indicated and she is considered stable for discharge, in improved condition, to follow up as an outpatient.  Labs:   Lab Results  Component Value Date   WBC 10.4 01/13/2013   HGB 12.6 01/13/2013   HCT 36.2 01/13/2013   MCV 90.5 01/13/2013   PLT 257 01/13/2013     Recent Labs Lab 01/13/13 0455  NA 142  K 4.0  CL 107  CO2 26  BUN 10  CREATININE 0.73  CALCIUM 8.7  GLUCOSE 112*   Lab Results  Component Value Date   TROPONINI 13.05* 01/12/2013  Radiology: Dg Chest 2 View 01/12/2013   CLINICAL DATA:  Chest pain  EXAM: CHEST - 2 VIEW  COMPARISON:  None available  FINDINGS: Surgical clips in the right axilla, upper abdomen, and anterior chest wall. Lungs clear. Heart size normal. No effusion. Regional bones unremarkable.  IMPRESSION: No acute cardiopulmonary disease.   Electronically Signed   By: Oley Balm M.D.   On: 01/12/2013 09:43    Cardiac Cath: 01/12/2013 Coronary angiography:  Coronary dominance: Right  Left Main: Normal in size with no significant disease.  Left Anterior  Descending (LAD): The LAD is normal in size but of small-caliber in general. There are minor irregularities proximally. In the mid to distal segment there is an 80% stenosis which is after the second diagonal.  1st diagonal (D1): Small in size with minor irregularities.  2nd diagonal (D2): Small in size with minor irregularities.  3rd diagonal (D3): Small in size with minor irregularities.  Circumflex (LCx): Normal in size and nondominant. There is a 99% hazy stenosis in the midsegment at the origin of OM 2 which is likely the culprit for presentation.  1st obtuse marginal: Very small in size with no significant disease.  2nd obtuse marginal: Small in size with 90% ostial stenosis.  3rd obtuse marginal: Normal in size with no significant disease.  Right Coronary Artery: Normal in size and dominant. There is a 40-50% tubular stenosis in the midsegment. There is 40-50% tubular stenosis in the distal segment.  Posterior descending artery: Normal in size with minor irregularities.  Posterior AV segment: Normal in size with minor irregularities.  Posterolateral branchs: Normal in size with no significant disease. Left ventriculography: Left ventricular systolic function is normal , LVEF is estimated at 55 %, there is no significant mitral regurgitation  PCI Data:  Vessel - left circumflex/Segment - mid  Percent Stenosis (pre) 99%  TIMI-flow 3  Stent : 2.25 x 15 mm Xience expedition drug-eluting stent postdilated with a 2.5 noncompliant balloon  Percent Stenosis (post) 0%  TIMI-flow (post) 3  Final Conclusions:  1. Significant 2 vessel coronary artery disease. The culprit for presentation as 99% stenosis in the mid left circumflex at the bifurcation of a small size OM 2.  2. Normal LV systolic function and left ventricular end-diastolic pressure.  3. Successful angioplasty and drug-eluting stent placement to the mid left circumflex. The small OM 2 was a jailed by the stent with loss of flow. This  was not rescued given that it is 1.5 mm in diameter and the patient had no chest pain or EKG changes.  Recommendations:  Dual antiplatelet therapy for at least 12 months. I will continue small dose bivalirudin for 4 hours. LAD PCI can be considered during this hospitalization if the patient has angina or in the future for anginal symptoms/abnormal stress test. The patient overall has small coronary arteries with diffuse disease. Aggressive treatment of risk factors is recommended.  EKG:  01/13/2013 Sinus rhythm Vent. rate 75 BPM PR interval 164 ms QRS duration 94 ms QT/QTc 410/457 ms P-R-T axes 27 81 103  FOLLOW UP PLANS AND APPOINTMENTS Allergies  Allergen Reactions  . Statins Other (See Comments)    Liver failure from previously taking  . Advil [Ibuprofen] Itching  . Sulfa Antibiotics Nausea And Vomiting     Medication List    STOP taking these medications       losartan 25 MG tablet  Commonly known as:  COZAAR      TAKE these medications  aspirin 81 MG tablet  Take 1 tablet (81 mg total) by mouth daily.     CO Q 10 PO  Take 1 capsule by mouth daily.     dicyclomine 10 MG capsule  Commonly known as:  BENTYL  Take 10 mg by mouth daily.     ezetimibe 10 MG tablet  Commonly known as:  ZETIA  Take 1 tablet (10 mg total) by mouth daily.     metoprolol tartrate 25 MG tablet  Commonly known as:  LOPRESSOR  Take 1 tablet (25 mg total) by mouth 2 (two) times daily.     nitroGLYCERIN 0.4 MG SL tablet  Commonly known as:  NITROSTAT  Place 1 tablet (0.4 mg total) under the tongue every 5 (five) minutes as needed for chest pain.     Ticagrelor 90 MG Tabs tablet  Commonly known as:  BRILINTA  Take 1 tablet (90 mg total) by mouth 2 (two) times daily.        Discharge Orders   Future Appointments Provider Department Dept Phone   02/24/2013 8:45 AM [facility]ry [place]) [facility] SITE 3 NUCLEAR MED [phone]   02/27/2013 3:30 PM Wendall Stade, MD [facility] [place] Office [phone]   Future Orders Complete By Expires   Amb Referral to Cardiac Rehabilitation  As directed    Comments:     Referring to [place] Phase 2   Diet - low sodium heart healthy  As directed    Increase activity slowly  As directed      Follow-up Information   Follow up with [facility] On 02/24/2013. (Treadmill Stress Test at 8:45 am)    Contact information:   [address] [place] [address]       Follow up with Charlton Haws, MD On 02/27/2013. (at 3:30 pm)    Specialty:  Cardiology   Contact information:   [address] [place] [address] [phone]       BRING ALL MEDICATIONS WITH YOU TO FOLLOW UP APPOINTMENTS  Time spent with patient to include physician time: 48 min Signed: Theodore Demark, PA-C 01/15/2013, 10:47 AM Co-Sign MD

## 2013-01-15 NOTE — Progress Notes (Signed)
CARDIAC REHAB PHASE I   PRE:  Rate/Rhythm: 67 SR  BP:  Supine:   Sitting: 110/64  Standing:    SaO2: 97 RA  MODE:  Ambulation: 1200 ft   POST:  Rate/Rhythm: 73  BP:  Supine:   Sitting: 108/70  Standing:    SaO2: 97 RA 0955-1030 Pt tolerated ambulation well without c/o of cp or SOB. VS stable. We discussed some of her diet issues.   Melina Copa RN 01/15/2013 10:32 AM

## 2013-01-15 NOTE — Progress Notes (Signed)
     Patient Name: Ashley Conley Date of Encounter: 01/15/2013  Active Problems:   NSTEMI (non-ST elevated myocardial infarction)    SUBJECTIVE: No chest pain overnight, feels she is getting better every day.  OBJECTIVE Filed Vitals:   01/14/13 0517 01/14/13 0953 01/14/13 1348 01/14/13 2031  BP: 94/63 128/78 122/74 137/81  Pulse: 75  86 80  Temp: 97 F (36.1 C)  97.3 F (36.3 C) 97.9 F (36.6 C)  TempSrc: Oral   Oral  Resp: 18  16   Height:      Weight:      SpO2: 97%  98% 94%   No intake or output data in the 24 hours ending 01/15/13 2130 Filed Weights   01/12/13 1005 01/12/13 1229 01/13/13 1146  Weight: 153 lb (69.4 kg) 158 lb 8.2 oz (71.9 kg) 153 lb (69.4 kg)    PHYSICAL EXAM General: Well developed, well nourished, female in no acute distress. Head: Normocephalic, atraumatic.  Neck: Supple without bruits, JVD not elevated. Lungs:  Resp regular and unlabored, CTA. Heart: RRR, S1, S2, no S3, S4, or murmur; no rub. Abdomen: Soft, non-tender, non-distended, BS + x 4.  Extremities: No clubbing, cyanosis, no edema.  Neuro: Alert and oriented X 3. Moves all extremities spontaneously. Psych: Normal affect.  LABS: CBC: Recent Labs  01/12/13 0920 01/12/13 0927 01/13/13 0455  WBC 10.8*  --  10.4  HGB 15.6* 16.0* 12.6  HCT 45.0 47.0* 36.2  MCV 90.4  --  90.5  PLT 317  --  257   INR: Recent Labs  01/12/13 0920  INR 0.91   Basic Metabolic Panel: Recent Labs  01/12/13 0927 01/13/13 0455  NA 141 142  K 4.2 4.0  CL 104 107  CO2  --  26  GLUCOSE 132* 112*  BUN 11 10  CREATININE 0.90 0.73  CALCIUM  --  8.7   Cardiac Enzymes: Recent Labs  01/12/13 0920  TROPONINI 13.05*    Recent Labs  01/12/13 0936  TROPIPOC 11.01*    TELE:  SR, no significant ectopy    Current Medications:  . aspirin  81 mg Oral Daily  . ezetimibe  10 mg Oral Daily  . metoprolol tartrate  25 mg Oral BID  . Ticagrelor  90 mg Oral BID      ASSESSMENT AND PLAN: Active  Problems:   NSTEMI (non-ST elevated myocardial infarction) - s/p stent to the CFX, no other critical disease, on ASA/BB/Zetia/Brilinta. EF normal.    ?Hyperlipidemia - intolerant to statins, ck profile as OP.   Plan: d/c when medically stable, MD advise on today.  Signed, Theodore Demark , PA-C 7:02 AM 01/15/2013  D/c home today hopefully after dietician sees patient. She is non diary and borderline diabetic and unable to take statin Will need stress myovue in 6-8 weeks to further assess LAD lesion  Charlton Haws

## 2013-01-15 NOTE — Progress Notes (Signed)
Pt provided with dc instructions and education. Pt verbalized understanding. Pt has no questions at this time. IV removed with tip intact. Hear tmonitor cleaned and returned to front. Charo Philipp, RN 

## 2013-02-13 HISTORY — PX: COLECTOMY: SHX59

## 2013-02-24 ENCOUNTER — Ambulatory Visit (HOSPITAL_COMMUNITY): Payer: 59 | Attending: Cardiology | Admitting: Radiology

## 2013-02-24 ENCOUNTER — Encounter: Payer: Self-pay | Admitting: Cardiology

## 2013-02-24 VITALS — BP 119/77 | HR 58 | Ht 62.0 in | Wt 147.0 lb

## 2013-02-24 DIAGNOSIS — R0609 Other forms of dyspnea: Secondary | ICD-10-CM | POA: Insufficient documentation

## 2013-02-24 DIAGNOSIS — R079 Chest pain, unspecified: Secondary | ICD-10-CM | POA: Insufficient documentation

## 2013-02-24 DIAGNOSIS — I252 Old myocardial infarction: Secondary | ICD-10-CM | POA: Insufficient documentation

## 2013-02-24 DIAGNOSIS — R0989 Other specified symptoms and signs involving the circulatory and respiratory systems: Secondary | ICD-10-CM | POA: Insufficient documentation

## 2013-02-24 DIAGNOSIS — R0602 Shortness of breath: Secondary | ICD-10-CM

## 2013-02-24 DIAGNOSIS — Z9861 Coronary angioplasty status: Secondary | ICD-10-CM | POA: Insufficient documentation

## 2013-02-24 DIAGNOSIS — E785 Hyperlipidemia, unspecified: Secondary | ICD-10-CM | POA: Insufficient documentation

## 2013-02-24 DIAGNOSIS — I2510 Atherosclerotic heart disease of native coronary artery without angina pectoris: Secondary | ICD-10-CM | POA: Insufficient documentation

## 2013-02-24 DIAGNOSIS — I10 Essential (primary) hypertension: Secondary | ICD-10-CM | POA: Insufficient documentation

## 2013-02-24 MED ORDER — TECHNETIUM TC 99M SESTAMIBI GENERIC - CARDIOLITE
11.0000 | Freq: Once | INTRAVENOUS | Status: AC | PRN
  Administered 2013-02-24: 11 via INTRAVENOUS

## 2013-02-24 MED ORDER — TECHNETIUM TC 99M SESTAMIBI GENERIC - CARDIOLITE
33.0000 | Freq: Once | INTRAVENOUS | Status: AC | PRN
  Administered 2013-02-24: 33 via INTRAVENOUS

## 2013-02-24 NOTE — Progress Notes (Signed)
[facility] [address] [place], [place] [address] [phone]    Cardiology Nuclear Med Study  Ashley Conley is a 63 y.o. female     MRN : [id number]     DOB: 07/22/1950  Procedure Date: 02/24/2013  Nuclear Med Background Indication for Stress Test:  Evaluation for Ischemia, and Patient seen in hospital on 01-12-13 for NSTEMI> Culprit mid [facility] with stent, residual 80% LAD  History:  CAD, MI, Cath 80% Cfx EF 55%, Stent Cfx, PTCA Cardiac Risk Factors: Hypertension and Lipids  Symptoms:  Chest Pain and DOE   Nuclear Pre-Procedure Caffeine/Decaff Intake:  None > 12 hrs NPO After: 7:00pm   Lungs:  clear O2 Sat: 99% on room air. IV 0.9% NS with Angio Cath:  22g  IV Site: L Antecubital x 1, tolerated well IV Started by:  Irven Baltimore, RN  Chest Size (in):  38 Cup Size: B  Height: 5\' 2"  (1.575 m)  Weight:  147 lb (66.679 kg)  BMI:  Body mass index is 26.88 kg/(m^2). Tech Comments:  Patient held Lopressor x 36 hrs    Nuclear Med Study 1 or 2 day study: 1 day  Stress Test Type:  Stress  Reading MD: N/A  Order Authorizing Provider:  Jenkins Rouge, MD  Resting Radionuclide: Technetium 68m Sestamibi  Resting Radionuclide Dose: 11.0 mCi   Stress Radionuclide:  Technetium 25m Sestamibi  Stress Radionuclide Dose: 33.0 mCi           Stress Protocol Rest HR: 58 Stress HR: 142  Rest BP: 119/77 Stress BP: 156/79  Exercise Time (min): 5:00 METS: 7.0           Dose of Adenosine (mg):  n/a Dose of Lexiscan: n/a mg  Dose of Atropine (mg): n/a Dose of Dobutamine: n/a mcg/kg/min (at max HR)  Stress Test Technologist: Glade Lloyd, BS-ES  Nuclear Technologist:  Charlton Amor, CNMT     Rest Procedure:  Myocardial perfusion imaging was performed at rest 45 minutes following the intravenous administration of Technetium 20m Sestamibi. Rest ECG: NSR - Normal EKG  Stress Procedure:  The patient exercised on the treadmill utilizing the Bruce Protocol for 5:00  minutes. The patient stopped due to fatigue and denied any chest pain.  Technetium 50m Sestamibi was injected at peak exercise and myocardial perfusion imaging was performed after a brief delay. Stress ECG: No significant change from baseline ECG  QPS Raw Data Images:  Normal; no motion artifact; normal heart/lung ratio. Stress Images:  Normal homogeneous uptake in all areas of the myocardium. Rest Images:  Normal homogeneous uptake in all areas of the myocardium. Subtraction (SDS):  No evidence of ischemia. Transient Ischemic Dilatation (Normal <1.22):  1.06 Lung/Heart Ratio (Normal <0.45):  0.37  Quantitative Gated Spect Images QGS EDV:  69 ml QGS ESV:  26 ml  Impression Exercise Capacity:  Fair exercise capacity. BP Response:  Normal blood pressure response. Clinical Symptoms:  No chest pain. ECG Impression:  No significant ST segment change suggestive of ischemia. Comparison with Prior Nuclear Study: No images to compare  Overall Impression:  Normal stress nuclear study.  LV Ejection Fraction: 63%.  LV Wall Motion:  NL LV Function; NL Wall Motion   [employer]

## 2013-02-26 ENCOUNTER — Encounter: Payer: Self-pay | Admitting: Cardiovascular Disease

## 2013-02-26 ENCOUNTER — Encounter: Payer: Self-pay | Admitting: *Deleted

## 2013-02-26 DIAGNOSIS — E785 Hyperlipidemia, unspecified: Secondary | ICD-10-CM | POA: Insufficient documentation

## 2013-02-26 DIAGNOSIS — C50919 Malignant neoplasm of unspecified site of unspecified female breast: Secondary | ICD-10-CM | POA: Insufficient documentation

## 2013-02-26 DIAGNOSIS — I10 Essential (primary) hypertension: Secondary | ICD-10-CM | POA: Insufficient documentation

## 2013-02-27 ENCOUNTER — Encounter: Payer: Self-pay | Admitting: Cardiovascular Disease

## 2013-02-27 ENCOUNTER — Ambulatory Visit (INDEPENDENT_AMBULATORY_CARE_PROVIDER_SITE_OTHER): Payer: 59 | Admitting: Cardiovascular Disease

## 2013-02-27 VITALS — BP 116/75 | HR 64 | Ht 62.0 in | Wt 150.8 lb

## 2013-02-27 DIAGNOSIS — Z79899 Other long term (current) drug therapy: Secondary | ICD-10-CM

## 2013-02-27 DIAGNOSIS — E785 Hyperlipidemia, unspecified: Secondary | ICD-10-CM

## 2013-02-27 DIAGNOSIS — I214 Non-ST elevation (NSTEMI) myocardial infarction: Secondary | ICD-10-CM

## 2013-02-27 DIAGNOSIS — I10 Essential (primary) hypertension: Secondary | ICD-10-CM

## 2013-02-27 LAB — CBC WITH DIFFERENTIAL/PLATELET
Basophils Absolute: 0 10*3/uL (ref 0.0–0.1)
Basophils Relative: 0.3 % (ref 0.0–3.0)
EOS ABS: 0.2 10*3/uL (ref 0.0–0.7)
Eosinophils Relative: 1.8 % (ref 0.0–5.0)
HCT: 39.8 % (ref 36.0–46.0)
Hemoglobin: 13.7 g/dL (ref 12.0–15.0)
Lymphocytes Relative: 19.1 % (ref 12.0–46.0)
Lymphs Abs: 2.2 10*3/uL (ref 0.7–4.0)
MCHC: 34.5 g/dL (ref 30.0–36.0)
MCV: 91 fl (ref 78.0–100.0)
Monocytes Absolute: 1.7 10*3/uL — ABNORMAL HIGH (ref 0.1–1.0)
Monocytes Relative: 14.6 % — ABNORMAL HIGH (ref 3.0–12.0)
NEUTROS PCT: 64.2 % (ref 43.0–77.0)
Neutro Abs: 7.4 10*3/uL (ref 1.4–7.7)
Platelets: 328 10*3/uL (ref 150.0–400.0)
RBC: 4.38 Mil/uL (ref 3.87–5.11)
RDW: 12.8 % (ref 11.5–14.6)
WBC: 11.5 10*3/uL — ABNORMAL HIGH (ref 4.5–10.5)

## 2013-02-27 NOTE — Assessment & Plan Note (Signed)
Stable with no angina and good activity level.  Continue medical Rx Continue DAT for a year 01/12/14  CBC with PLT count today  Normal stress myovue suggests circ stent patency and medical  Rx of residual distal LAD disease ok

## 2013-02-27 NOTE — Progress Notes (Signed)
Patient ID: Ashley Conley, female   DOB: 1950/12/08, 63 y.o.   MRN: [id number]  63 yo known CAD  Had SEMI in 01/12/13 Cath with Arida with stent to circumflex    Coronary angiography:  Coronary dominance: Right  Left Main: Normal in size with no significant disease.  Left Anterior Descending (LAD): The LAD is normal in size but of small-caliber in general. There are minor irregularities proximally. In the mid to distal segment there is an 80% stenosis which is after the second diagonal.  1st diagonal (D1): Small in size with minor irregularities.  2nd diagonal (D2): Small in size with minor irregularities.  3rd diagonal (D3): Small in size with minor irregularities.  Circumflex (LCx): Normal in size and nondominant. There is a 99% hazy stenosis in the midsegment at the origin of OM 2 which is likely the culprit for presentation.  1st obtuse marginal: Very small in size with no significant disease.  2nd obtuse marginal: Small in size with 90% ostial stenosis.  3rd obtuse marginal: Normal in size with no significant disease.  Right Coronary Artery: Normal in size and dominant. There is a 40-50% tubular stenosis in the midsegment. There is 40-50% tubular stenosis in the distal segment.  Posterior descending artery: Normal in size with minor irregularities.  Posterior AV segment: Normal in size with minor irregularities.  Posterolateral branchs: Normal in size with no significant disease. Left ventriculography: Left ventricular systolic function is normal , LVEF is estimated at 55 %, there is no significant mitral regurgitation   F/U myovue with no ischemia and normal EF 63% done 02/25/13  ROS: Denies fever, malais, weight loss, blurry vision, decreased visual acuity, cough, sputum, SOB, hemoptysis, pleuritic pain, palpitaitons, heartburn, abdominal pain, melena, lower extremity edema, claudication, or rash.  All other systems reviewed and negative  General: Affect appropriate Healthy:  appears  stated age 63: normal Neck supple with no adenopathy JVP normal no bruits no thyromegaly Lungs clear with no wheezing and good diaphragmatic motion Heart:  S1/S2 no murmur, no rub, gallop or click PMI normal Abdomen: benighn, BS positve, no tenderness, no AAA no bruit.  No HSM or HJR Distal pulses intact with no bruits No edema Neuro non-focal Skin warm and dry No muscular weakness   Current Outpatient Prescriptions  Medication Sig Dispense Refill  . aspirin 81 MG tablet Take 1 tablet (81 mg total) by mouth daily.      . Coenzyme Q10 (CO Q 10 PO) Take 1 capsule by mouth daily.      Marland Kitchen dicyclomine (BENTYL) 10 MG capsule Take 10 mg by mouth daily.      Marland Kitchen ezetimibe (ZETIA) 10 MG tablet Take 1 tablet (10 mg total) by mouth daily.  30 tablet  11  . metoprolol tartrate (LOPRESSOR) 25 MG tablet Take 1 tablet (25 mg total) by mouth 2 (two) times daily.  60 tablet  11  . nitroGLYCERIN (NITROSTAT) 0.4 MG SL tablet Place 1 tablet (0.4 mg total) under the tongue every 5 (five) minutes as needed for chest pain.  25 tablet  3  . Ticagrelor (BRILINTA) 90 MG TABS tablet Take 1 tablet (90 mg total) by mouth 2 (two) times daily.  60 tablet  11   No current facility-administered medications for this visit.    Allergies  Statins; Advil; and Sulfa antibiotics  Electrocardiogram:  11/30  SR rate 76 IMI   Assessment and Plan

## 2013-02-27 NOTE — Assessment & Plan Note (Addendum)
Well controlled.  Continue current medications and low sodium Dash type diet.    

## 2013-02-27 NOTE — Assessment & Plan Note (Signed)
Intol to statins on zetia f/u labs 3 months

## 2013-02-27 NOTE — Patient Instructions (Signed)
Your physician wants you to follow-up in:   [place] will receive a reminder letter in the mail two months in advance. If you don't receive a letter, please call our office to schedule the follow-up appointment. Your physician recommends that you continue on your current medications as directed. Please refer to the Current Medication list given to you today.  Your physician recommends that you return for lab work in: TODAY  CBC WITH  DIFF

## 2013-03-05 ENCOUNTER — Telehealth: Payer: Self-pay | Admitting: Cardiovascular Disease

## 2013-03-05 NOTE — Telephone Encounter (Signed)
Follow Up ° °Pt returned call for results//SR  °

## 2013-03-05 NOTE — Telephone Encounter (Signed)
PT AWARE OF LAB RESULTS./CY 

## 2013-07-10 ENCOUNTER — Other Ambulatory Visit (HOSPITAL_COMMUNITY): Payer: Self-pay | Admitting: Obstetrics & Gynecology

## 2013-07-10 DIAGNOSIS — N823 Fistula of vagina to large intestine: Secondary | ICD-10-CM

## 2013-07-18 ENCOUNTER — Ambulatory Visit (HOSPITAL_COMMUNITY)
Admission: RE | Admit: 2013-07-18 | Discharge: 2013-07-18 | Disposition: A | Discharge status: Home / Self Care | Payer: 59 | Source: Ambulatory Visit | Attending: Obstetrics & Gynecology | Admitting: Obstetrics & Gynecology

## 2013-07-18 DIAGNOSIS — K5730 Diverticulosis of large intestine without perforation or abscess without bleeding: Secondary | ICD-10-CM | POA: Insufficient documentation

## 2013-07-18 DIAGNOSIS — R109 Unspecified abdominal pain: Secondary | ICD-10-CM | POA: Insufficient documentation

## 2013-07-18 DIAGNOSIS — N823 Fistula of vagina to large intestine: Secondary | ICD-10-CM

## 2013-07-18 DIAGNOSIS — Z90710 Acquired absence of both cervix and uterus: Secondary | ICD-10-CM | POA: Insufficient documentation

## 2013-07-25 ENCOUNTER — Other Ambulatory Visit: Payer: Self-pay | Admitting: Obstetrics & Gynecology

## 2013-07-25 DIAGNOSIS — K632 Fistula of intestine: Secondary | ICD-10-CM

## 2013-07-30 ENCOUNTER — Ambulatory Visit
Admission: RE | Admit: 2013-07-30 | Discharge: 2013-07-30 | Disposition: A | Discharge status: Home / Self Care | Payer: 59 | Source: Ambulatory Visit | Attending: Obstetrics & Gynecology | Admitting: Obstetrics & Gynecology

## 2013-07-30 ENCOUNTER — Other Ambulatory Visit: Payer: Self-pay | Admitting: Obstetrics & Gynecology

## 2013-07-30 DIAGNOSIS — K632 Fistula of intestine: Secondary | ICD-10-CM

## 2013-08-20 ENCOUNTER — Encounter (INDEPENDENT_AMBULATORY_CARE_PROVIDER_SITE_OTHER): Payer: Self-pay

## 2013-09-05 ENCOUNTER — Ambulatory Visit (INDEPENDENT_AMBULATORY_CARE_PROVIDER_SITE_OTHER): Payer: 59 | Admitting: General Surgery

## 2013-09-16 ENCOUNTER — Telehealth (INDEPENDENT_AMBULATORY_CARE_PROVIDER_SITE_OTHER): Payer: Self-pay

## 2013-09-16 ENCOUNTER — Encounter (INDEPENDENT_AMBULATORY_CARE_PROVIDER_SITE_OTHER): Payer: Self-pay | Admitting: General Surgery

## 2013-09-16 ENCOUNTER — Ambulatory Visit (INDEPENDENT_AMBULATORY_CARE_PROVIDER_SITE_OTHER): Payer: 59 | Admitting: General Surgery

## 2013-09-16 VITALS — BP 118/70 | HR 72 | Temp 97.1°F | Ht 62.0 in | Wt 141.0 lb

## 2013-09-16 DIAGNOSIS — N824 Other female intestinal-genital tract fistulae: Secondary | ICD-10-CM

## 2013-09-16 MED ORDER — NEOMYCIN SULFATE 500 MG PO TABS: 1000.0000 mg | ORAL_TABLET | ORAL | Status: AC

## 2013-09-16 MED ORDER — METRONIDAZOLE 500 MG PO TABS: 500.0000 mg | ORAL_TABLET | ORAL | Status: AC

## 2013-09-16 NOTE — Progress Notes (Signed)
Chief Complaint  Patient presents with  . eval fistula    HISTORY: Ashley Conley is a 63 y.o. female who presents to the office with vaginal drainage since March.  She saw her Gyn about it in May who saw a area of granulation tissue in R apex of vagina.  Colovaginal communication was noted.   Her last colonoscopy was in 2013 and was normal per pt. This was done for chronic RLQ pain.  She is s/p bilateral TRAM flaps for breast cancer 25 yrs ago.  She had a large piece of abdominal mesh placed during this surgery.      Past Medical History  Diagnosis Date  . Breast cancer   . Hypertension   . Hyperlipidemia     Statins caused to elevated LFTs  . NSTEMI (non-ST elevated myocardial infarction)   . CAD (coronary artery disease)       Past Surgical History  Procedure Laterality Date  . Abdominal hysterectomy    . Reconstruction breast w/ tram flap      1992  . Breast surgery    . Liver biopsy      Performed for abnormal LFTs in the setting of statin use  . Bilateral salpingooophoroectomy Bilateral 2005      Current Outpatient Prescriptions  Medication Sig Dispense Refill  . aspirin 81 MG tablet Take 1 tablet (81 mg total) by mouth daily.      . Coenzyme Q10 (CO Q 10 PO) Take 1 capsule by mouth daily.      Marland Kitchen dicyclomine (BENTYL) 10 MG capsule Take 10 mg by mouth daily.      Marland Kitchen ezetimibe (ZETIA) 10 MG tablet Take 1 tablet (10 mg total) by mouth daily.  30 tablet  11  . metoprolol tartrate (LOPRESSOR) 25 MG tablet Take 1 tablet (25 mg total) by mouth 2 (two) times daily.  60 tablet  11  . nitroGLYCERIN (NITROSTAT) 0.4 MG SL tablet Place 1 tablet (0.4 mg total) under the tongue every 5 (five) minutes as needed for chest pain.  25 tablet  3  . omega-3 acid ethyl esters (LOVAZA) 1 G capsule Take 1 g by mouth daily.      . Ticagrelor (BRILINTA) 90 MG TABS tablet Take 1 tablet (90 mg total) by mouth 2 (two) times daily.  60 tablet  11  . metroNIDAZOLE (FLAGYL) 500 MG tablet Take 1 tablet (500  mg total) by mouth as directed.  6 tablet  0  . neomycin (MYCIFRADIN) 500 MG tablet Take 2 tablets (1,000 mg total) by mouth as directed.  6 tablet  0   No current facility-administered medications for this visit.      Allergies  Allergen Reactions  . Statins Other (See Comments)    Liver failure from previously taking  . Advil [Ibuprofen] Itching  . Sulfa Antibiotics Nausea And Vomiting      No family history on file.    History   Social History  . Marital Status: Married    Spouse Name: N/A    Number of Children: N/A  . Years of Education: N/A   Social History Main Topics  . Smoking status: Never Smoker   . Smokeless tobacco: None  . Alcohol Use: No  . Drug Use: No  . Sexual Activity: Yes    Birth Control/ Protection: Post-menopausal   Other Topics Concern  . None   Social History Narrative  . None       REVIEW OF SYSTEMS - PERTINENT POSITIVES  ONLY: Review of Systems - General ROS: negative for - chills or fever Respiratory ROS: no cough, shortness of breath, or wheezing Cardiovascular ROS: no chest pain or dyspnea on exertion Gastrointestinal ROS: no abdominal pain, change in bowel habits, or black or bloody stools  EXAM: Filed Vitals:   09/16/13 0943  BP: 118/70  Pulse: 72  Temp: 97.1 F (36.2 C)    Gen:  No acute distress.  Well nourished and well groomed.   Neurological: Alert and oriented to person, place, and time. Coordination normal.  Head: Normocephalic and atraumatic.  Eyes: Conjunctivae are normal. Pupils are equal, round, and reactive to light. No scleral icterus.  Neck: Normal range of motion. Neck supple. No tracheal deviation or thyromegaly present.  No cervical lymphadenopathy. Cardiovascular: Normal rate, regular rhythm, normal heart sounds and intact distal pulses. Respiratory: Effort normal.  No respiratory distress. No chest wall tenderness. Breath sounds normal.  No wheezes, rales or rhonchi.  GI: Soft. Bowel sounds are normal. The  abdomen is soft and nontender.  There is no rebound and no guarding.  Musculoskeletal: Normal range of motion. Extremities are nontender.  Skin: Skin is warm and dry. No rash noted. No diaphoresis. No erythema. No pallor. No clubbing, cyanosis, or edema.   Psychiatric: Normal mood and affect. Behavior is normal. Judgment and thought content normal.    LABORATORY RESULTS: Available labs are reviewed    RADIOLOGY RESULTS:   Images and reports are reviewed. CT Abd and pelvis IMPRESSION:  Soft tissue thickening of the descending colon. There is irregular  soft tissue extending from the sigmoid colon to the right lateral  aspect of the vagina with surrounding fat stranding. No definite  contrast is demonstrated coursing from the sigmoid colon/rectum into  the vagina on this examination. 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 Further examination with  fluoroscopic study could be performed as clinically indicated.  Soft tissue thickening and surrounding fat stranding is favored to  be inflammatory from possible fistulous connection and prior  episodes of diverticulitis. Underlying malignant etiology would not  be entirely excluded.      ASSESSMENT AND PLAN: Ashley Conley is a 63 y.o. 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  She is s/p colonoscopy ~2 yrs ago.  We will obtain these records.  We discussed a robotic sigmoidectomy to remedy her problem.  I will ask Dr Johnsie Cancel to evaluate for cardiac risk stratification and what to do with her anticoagulation around surgery.  I would also like to perform a sigmoidoscopy on the morning of surgery to evaluate her colonic mucosa as well.    The surgery and anatomy were described to the patient as well as the risks of surgery and the possible complications.  These include: Bleeding, deep abdominal infections and possible wound complications  such as hernia and infection, damage to adjacent structures, leak of surgical connections, which can lead to other surgeries and possibly an ostomy, possible need for other procedures, such as abscess drains in radiology, possible prolonged hospital stay, possible diarrhea from removal of part of the colon, possible constipation from narcotics, prolonged fatigue/weakness or appetite loss, possible early recurrence of of disease, possible complications of their medical problems such as heart disease or arrhythmias or lung problems, death (less than 1%). I believe the patient understands and wishes to proceed with the surgery.     Rosario Adie, MD Colon and  Rectal Surgery / [facility]      Visit Diagnoses: 1. Colovaginal fistula     Primary Care Physician: Imagene Riches, NP

## 2013-09-16 NOTE — Telephone Encounter (Signed)
Pt's colonoscopy was performed at [facility]. 10/12/2013.

## 2013-09-16 NOTE — Patient Instructions (Signed)
COLON BOWEL PREP                                                                          °Please follow the instructions carefully. It is important to clean out your bowels & take the prescribed antibiotic pills to lower your chances of a wound infection or abscess.  ° °FIVE DAYS PRIOR TO YOUR SURGERY ° °Stop eating any nuts, popcorn, or fruit with seeds. Stop all fiber supplements such as Metamucil, Citrucel, etc.   °Hold taking any blood thinning anticoagulation medication (ex: aspirin, warfarin/Coumadin, Plavix, Xarelto, Eliquis, Pradaxa, etc) as recommended by your medical/cardiology doctor ° °Obtain what you need at a pharmacy of your choice: °-Filled out prescriptions for your oral antibiotics (Neomycin & Metronidazole)  °-A bottle of MiraLax / Glycolax (288g) - no prescription required  °-A large bottle of Gatorade / Powerade (64oz)  °-Dulcolax tablets (4 tabs) - no prescription required  ° ° °DAY PRIOR TO SURGERY  ° °7:00am °Swallow 4 Dulcolax tablets with some water °Drink plenty of clear liquids all day to avoid getting dehydrated (Water, juice, soda, coffee, tea, bouillon, jello, etc.) ° °10:00am °Mix the bottle of MiraLax with the 64-oz bottle of Gatorade.  Drink the Gatorade mixture gradually over the next few hours (8oz glass every 15-30 minutes) until gone. You should finish by 2pm. ° °2:00pm °Take 2 Neomycin 500mg tablets & 2 Metronidazole 500mg tablets ° °3:00pm °Take 2 Neomycin 500mg tablets & 2 Metronidazole 500mg tablets  °Drink plenty of clear liquids all evening to avoid getting dehydrated ° °10:00pm °Take 2 Neomycin 500mg tablets & 2 Metronidazole 500mg tablets  °Do not eat or drink anything after bedtime (midnight) the night before your surgery. ° ° °MORNING OF SURGERY °Remember to not to drink or eat anything that morning  °Hold or take medications as recommended by the hospital staff at your Preoperative visit ° °If you have questions or concerns, please call [facility] [phone] during business hours to speak to the clinical staff for advice. °

## 2013-09-17 ENCOUNTER — Emergency Department (HOSPITAL_COMMUNITY)
Admission: EM | Admit: 2013-09-17 | Discharge: 2013-09-17 | Disposition: A | Discharge status: Home / Self Care | Payer: 59 | Attending: Emergency Medicine | Admitting: Emergency Medicine

## 2013-09-17 ENCOUNTER — Encounter (INDEPENDENT_AMBULATORY_CARE_PROVIDER_SITE_OTHER): Payer: Self-pay

## 2013-09-17 ENCOUNTER — Other Ambulatory Visit: Payer: Self-pay

## 2013-09-17 ENCOUNTER — Emergency Department (HOSPITAL_COMMUNITY): Payer: 59

## 2013-09-17 ENCOUNTER — Encounter (HOSPITAL_COMMUNITY): Payer: Self-pay | Admitting: Emergency Medicine

## 2013-09-17 DIAGNOSIS — I252 Old myocardial infarction: Secondary | ICD-10-CM | POA: Diagnosis not present

## 2013-09-17 DIAGNOSIS — Z853 Personal history of malignant neoplasm of breast: Secondary | ICD-10-CM | POA: Insufficient documentation

## 2013-09-17 DIAGNOSIS — I10 Essential (primary) hypertension: Secondary | ICD-10-CM | POA: Insufficient documentation

## 2013-09-17 DIAGNOSIS — R110 Nausea: Secondary | ICD-10-CM | POA: Insufficient documentation

## 2013-09-17 DIAGNOSIS — Z7982 Long term (current) use of aspirin: Secondary | ICD-10-CM | POA: Insufficient documentation

## 2013-09-17 DIAGNOSIS — Z90710 Acquired absence of both cervix and uterus: Secondary | ICD-10-CM | POA: Insufficient documentation

## 2013-09-17 DIAGNOSIS — R5383 Other fatigue: Secondary | ICD-10-CM

## 2013-09-17 DIAGNOSIS — Z7902 Long term (current) use of antithrombotics/antiplatelets: Secondary | ICD-10-CM | POA: Insufficient documentation

## 2013-09-17 DIAGNOSIS — R079 Chest pain, unspecified: Secondary | ICD-10-CM

## 2013-09-17 DIAGNOSIS — R5381 Other malaise: Secondary | ICD-10-CM | POA: Diagnosis present

## 2013-09-17 DIAGNOSIS — Z79899 Other long term (current) drug therapy: Secondary | ICD-10-CM | POA: Insufficient documentation

## 2013-09-17 DIAGNOSIS — I2510 Atherosclerotic heart disease of native coronary artery without angina pectoris: Secondary | ICD-10-CM

## 2013-09-17 DIAGNOSIS — E785 Hyperlipidemia, unspecified: Secondary | ICD-10-CM | POA: Diagnosis not present

## 2013-09-17 DIAGNOSIS — R0789 Other chest pain: Secondary | ICD-10-CM | POA: Insufficient documentation

## 2013-09-17 LAB — URINALYSIS, ROUTINE W REFLEX MICROSCOPIC
Bilirubin Urine: NEGATIVE
Glucose, UA: NEGATIVE mg/dL
Hgb urine dipstick: NEGATIVE
KETONES UR: NEGATIVE mg/dL
NITRITE: NEGATIVE
Protein, ur: NEGATIVE mg/dL
Specific Gravity, Urine: 1.008 (ref 1.005–1.030)
UROBILINOGEN UA: 1 mg/dL (ref 0.0–1.0)
pH: 6 (ref 5.0–8.0)

## 2013-09-17 LAB — BASIC METABOLIC PANEL
Anion gap: 15 (ref 5–15)
BUN: 13 mg/dL (ref 6–23)
CHLORIDE: 98 meq/L (ref 96–112)
CO2: 25 mEq/L (ref 19–32)
Calcium: 9.2 mg/dL (ref 8.4–10.5)
Creatinine, Ser: 0.79 mg/dL (ref 0.50–1.10)
GFR calc non Af Amer: 87 mL/min — ABNORMAL LOW (ref >90)
GLUCOSE: 111 mg/dL — AB (ref 70–99)
POTASSIUM: 4 meq/L (ref 3.7–5.3)
Sodium: 138 mEq/L (ref 137–147)

## 2013-09-17 LAB — CBC
HEMATOCRIT: 40 % (ref 36.0–46.0)
Hemoglobin: 13.1 g/dL (ref 12.0–15.0)
MCH: 30.1 pg (ref 26.0–34.0)
MCHC: 32.8 g/dL (ref 30.0–36.0)
MCV: 92 fL (ref 78.0–100.0)
Platelets: 402 10*3/uL — ABNORMAL HIGH (ref 150–400)
RBC: 4.35 MIL/uL (ref 3.87–5.11)
RDW: 12.3 % (ref 11.5–15.5)
WBC: 16.2 10*3/uL — AB (ref 4.0–10.5)

## 2013-09-17 LAB — I-STAT TROPONIN, ED: Troponin i, poc: 0 ng/mL (ref 0.00–0.08)

## 2013-09-17 LAB — HEPATIC FUNCTION PANEL
ALT: 22 U/L (ref 0–35)
AST: 21 U/L (ref 0–37)
Albumin: 3.5 g/dL (ref 3.5–5.2)
Alkaline Phosphatase: 74 U/L (ref 39–117)
Total Bilirubin: 0.3 mg/dL (ref 0.3–1.2)
Total Protein: 7.8 g/dL (ref 6.0–8.3)

## 2013-09-17 LAB — URINE MICROSCOPIC-ADD ON

## 2013-09-17 LAB — LIPASE, BLOOD: LIPASE: 28 U/L (ref 11–59)

## 2013-09-17 LAB — TROPONIN I

## 2013-09-17 NOTE — Discharge Instructions (Signed)
Continue all home medications. Follow-up with Dr. Johnsie Cancel. Return to the ED for new or worsening symptoms.

## 2013-09-17 NOTE — Addendum Note (Signed)
Addended by: Rosario Adie on: 07/21/1243 03:28 PM   Modules accepted: Orders

## 2013-09-17 NOTE — Consult Note (Signed)
Cardiology Consult Note  Admit date: 09/17/2013 Name: Ashley Conley 63 y.o.  female DOB:  01-05-1951 MRN:  [id number]  Today's date:  09/17/2013  Referring Physician:    [facility]  Reason for Consultation:  Chest pain  IMPRESSIONS: 1. Mild atypical chest pain but I doubt is an acute coronary syndrome 2. Clinical presentation is that of shaking and vomiting with an elevated white count to need to be sure she is not having infection 3. Coronary artery disease with previous stent of the circumflex  RECOMMENDATION: 1. Repeat serial troponins. If they're negative I think the clinical index of an acute myocardial event would be small 2. Since her white count is high, I would like to get the emergency room doctor to relook at the patient in light of the clinical history  HISTORY: This 63 year old female has a history of a non-STEMI in November and had a drug-eluting stent to the circumflex. She has felt well and done well since that time with no recurrence of symptoms. Today she was sitting on her couch and had the onset of severe shaking and could not get warm and felt very cold. She then began to vomit and after vomiting had a very mild amount of chest discomfort that did not last long. Because she had some symptoms of vomiting at that time she had her infarction previously she became frightened and came to the emergency room. She has not had chest discomfort since she was here and an EKG is unremarkable. She says that she feels a lot better at the present time. She has not previously had exertional chest pain and up until the event today was feeling fine.  Past Medical History  Diagnosis Date  . Breast cancer   . Hypertension   . Hyperlipidemia     Statins caused to elevated LFTs  . NSTEMI (non-ST elevated myocardial infarction)   . CAD (coronary artery disease)       Past Surgical History  Procedure Laterality Date  . Abdominal hysterectomy    . Reconstruction breast w/ tram  flap      1992  . Breast surgery    . Liver biopsy      Performed for abnormal LFTs in the setting of statin use  . Bilateral salpingooophoroectomy Bilateral 2005     Allergies:  is allergic to statins; sulfa antibiotics; and advil.   Medications: Prior to Admission medications   Medication Sig Start Date End Date Taking? Authorizing Provider  aspirin 81 MG tablet Take 1 tablet (81 mg total) by mouth daily. 01/15/13  Yes Rhonda G Barrett, PA-C  Coenzyme Q10 (COQ-10) 200 MG CAPS Take 200 mg by mouth daily.   Yes Historical Provider, MD  ezetimibe (ZETIA) 10 MG tablet Take 1 tablet (10 mg total) by mouth daily. 01/15/13  Yes Rhonda G Barrett, PA-C  metoprolol tartrate (LOPRESSOR) 25 MG tablet Take 1 tablet (25 mg total) by mouth 2 (two) times daily. 01/15/13  Yes Rhonda G Barrett, PA-C  nitroGLYCERIN (NITROSTAT) 0.4 MG SL tablet Place 1 tablet (0.4 mg total) under the tongue every 5 (five) minutes as needed for chest pain. 01/15/13  Yes Rhonda G Barrett, PA-C  RA KRILL OIL 500 MG CAPS Take 500 mg by mouth daily.   Yes Historical Provider, MD  Ticagrelor (BRILINTA) 90 MG TABS tablet Take 1 tablet (90 mg total) by mouth 2 (two) times daily. 01/15/13  Yes Rhonda G Barrett, PA-C  aspirin 81 MG chewable tablet Chew 324 mg  by mouth once.    Historical Provider, MD  metroNIDAZOLE (FLAGYL) 500 MG tablet Take 1 tablet (500 mg total) by mouth as directed. 04/15/81 2/91/91  Leighton Ruff, MD  neomycin (MYCIFRADIN) 500 MG tablet Take 2 tablets (1,000 mg total) by mouth as directed. 07/19/04 0/0/45  Leighton Ruff, MD    Family History: Family Status  Relation Status Death Age  . Father    . Mother      Social History:   reports that she has never smoked. She has never used smokeless tobacco. She reports that she does not drink alcohol or use illicit drugs.   History   Social History Narrative  . No narrative on file    Review of Systems: Other than as noted above the remainder of the review of  systems is unremarkable  Physical Exam: BP 124/82  Pulse 87  Temp(Src) 98 F (36.7 C) (Oral)  Resp 22  Ht 5\' 2"  (1.575 m)  Wt 65.772 kg (145 lb)  BMI 26.51 kg/m2  SpO2 96%  General appearance: Pleasant white female currently in no acute distress Head: Normocephalic, without obvious abnormality, atraumatic Neck: no adenopathy, no carotid bruit, no JVD and supple, symmetrical, trachea midline Lungs: clear to auscultation bilaterally Heart: regular rate and rhythm, S1, S2 normal, no murmur, click, rub or gallop Abdomen: soft, non-tender; bowel sounds normal; no masses,  no organomegaly Pelvic: deferred Extremities: extremities normal, atraumatic, no cyanosis or edema Pulses: 2+ and symmetric Neurologic: Grossly normal   Labs: CBC  Recent Labs  09/17/13 1645  WBC 16.2*  RBC 4.35  HGB 13.1  HCT 40.0  PLT 402*  MCV 92.0  MCH 30.1  MCHC 32.8  RDW 12.3   CMP   Recent Labs  09/17/13 1645  NA 138  K 4.0  CL 98  CO2 25  GLUCOSE 111*  BUN 13  CREATININE 0.79  CALCIUM 9.2  GFRNONAA 87*  GFRAA >90   Cardiac Panel (last 3 results) Troponin (Point of Care Test)  Recent Labs  09/17/13 1657  TROPIPOC 0.00     Radiology: Postsurgical changes, no acute disease  EKG: Small inferior Q waves  Signed:  W. Doristine Church MD [facility]   Cardiology Consultant  09/17/2013, 7:08 PM

## 2013-09-17 NOTE — ED Notes (Signed)
C/o general weakness and nausea denies chest pain or any discomfort at present pt states did have pain to center of chest and took ntg x1 and pain had left and remains gone

## 2013-09-17 NOTE — ED Provider Notes (Signed)
CSN: [id number]     Arrival date & time 09/17/13  1632 History   First MD Initiated Contact with Patient 09/17/13 1636     Chief Complaint  Patient presents with  . Fatigue     (Consider location/radiation/quality/duration/timing/severity/associated sxs/prior Treatment) The history is provided by the patient and medical records.   This is a 63 year old female with family history significant for hypertension, hyperlipidemia, coronary artery disease, NSTEMI November 2014 s/p stent placement, presenting to the ED for generalized weakness and fatigue beginning this afternoon.  Patient states she is at home on her couch knitting when she developed generalized fatigue, nausea, and some midsternal chest discomfort. She denies any shortness of breath, palpitations, dizziness, pain of neck or extremities.  Patient states these are the exact symptoms that occurred with her prior MI so she became concerned.  She took  325 aspirin and one sublingual nitroglycerin prior to arrival with complete resolution of pain. Patient denies any recent fever, chills, sweats, or illness.  Patient states she's been doing well since her previous MI, she does not frequently have anginal pain.  Followed by cardiology, Dr. Johnsie Cancel.  Past Medical History  Diagnosis Date  . Breast cancer   . Hypertension   . Hyperlipidemia     Statins caused to elevated LFTs  . NSTEMI (non-ST elevated myocardial infarction)   . CAD (coronary artery disease)    Past Surgical History  Procedure Laterality Date  . Abdominal hysterectomy    . Reconstruction breast w/ tram flap      1992  . Breast surgery    . Liver biopsy      Performed for abnormal LFTs in the setting of statin use  . Bilateral salpingooophoroectomy Bilateral 2005   History reviewed. No pertinent family history. History  Substance Use Topics  . Smoking status: Never Smoker   . Smokeless tobacco: Not on file  . Alcohol Use: No   OB History   Grav Para Term Preterm  Abortions TAB SAB Ect Mult Living                 Review of Systems  Constitutional: Positive for fatigue.  Cardiovascular: Positive for chest pain.  Gastrointestinal: Positive for nausea.  Neurological: Positive for weakness.  All other systems reviewed and are negative.     Allergies  Statins; Sulfa antibiotics; and Advil  Home Medications   Prior to Admission medications   Medication Sig Start Date End Date Taking? Authorizing Provider  aspirin 81 MG tablet Take 1 tablet (81 mg total) by mouth daily. 01/15/13  Yes Rhonda G Barrett, PA-C  Coenzyme Q10 (COQ-10) 200 MG CAPS Take 200 mg by mouth daily.   Yes Historical Provider, MD  ezetimibe (ZETIA) 10 MG tablet Take 1 tablet (10 mg total) by mouth daily. 01/15/13  Yes Rhonda G Barrett, PA-C  metoprolol tartrate (LOPRESSOR) 25 MG tablet Take 1 tablet (25 mg total) by mouth 2 (two) times daily. 01/15/13  Yes Rhonda G Barrett, PA-C  RA KRILL OIL 500 MG CAPS Take 500 mg by mouth daily.   Yes Historical Provider, MD  Ticagrelor (BRILINTA) 90 MG TABS tablet Take 1 tablet (90 mg total) by mouth 2 (two) times daily. 01/15/13  Yes Rhonda G Barrett, PA-C  aspirin 81 MG chewable tablet Chew 324 mg by mouth once.    Historical Provider, MD  dicyclomine (BENTYL) 10 MG capsule Take 10 mg by mouth daily.    Historical Provider, MD  metroNIDAZOLE (FLAGYL) 500 MG tablet Take  1 tablet (500 mg total) by mouth as directed. 07/16/11 0/86/57  Leighton Ruff, MD  neomycin (MYCIFRADIN) 500 MG tablet Take 2 tablets (1,000 mg total) by mouth as directed. 09/17/67 07/15/93  Leighton Ruff, MD  nitroGLYCERIN (NITROSTAT) 0.4 MG SL tablet Place 1 tablet (0.4 mg total) under the tongue every 5 (five) minutes as needed for chest pain. 01/15/13   Rhonda G Barrett, PA-C   BP 109/65  Pulse 96  Temp(Src) 98 F (36.7 C) (Oral)  Resp 20  Ht 5\' 2"  (1.575 m)  Wt 145 lb (65.772 kg)  BMI 26.51 kg/m2  SpO2 95%  Physical Exam  Nursing note and vitals reviewed. Constitutional:  She is oriented to person, place, and time. She appears well-developed and well-nourished. No distress.  HENT:  Head: Normocephalic and atraumatic.  Mouth/Throat: Oropharynx is clear and moist.  Eyes: Conjunctivae and EOM are normal. Pupils are equal, round, and reactive to light.  Neck: Normal range of motion. Neck supple.  Cardiovascular: Normal rate, regular rhythm and normal heart sounds.   Pulmonary/Chest: Effort normal and breath sounds normal. No respiratory distress. She has no wheezes.  Abdominal: Soft. Bowel sounds are normal. There is no tenderness. There is no guarding.  Musculoskeletal: Normal range of motion. She exhibits no edema.  Neurological: She is alert and oriented to person, place, and time.  AAOx3, answering questions appropriately; equal strength UE and LE bilaterally; CN grossly intact; moves all extremities appropriately without ataxia; no focal neuro deficits or facial asymmetry appreciated  Skin: Skin is warm and dry. She is not diaphoretic.  Psychiatric: She has a normal mood and affect.    ED Course  Procedures (including critical care time) Labs Review Labs Reviewed  CBC - Abnormal; Notable for the following:    WBC 16.2 (*)    Platelets 402 (*)    All other components within normal limits  BASIC METABOLIC PANEL - Abnormal; Notable for the following:    Glucose, Bld 111 (*)    GFR calc non Af Amer 87 (*)    All other components within normal limits  URINALYSIS, ROUTINE W REFLEX MICROSCOPIC - Abnormal; Notable for the following:    Leukocytes, UA LARGE (*)    All other components within normal limits  HEPATIC FUNCTION PANEL  LIPASE, BLOOD  TROPONIN I  URINE MICROSCOPIC-ADD ON  Randolm Idol, ED    Imaging Review Dg Chest Port 1 View  09/17/2013   CLINICAL DATA:  Generalized weakness, chest pain, nausea  EXAM: PORTABLE CHEST - 1 VIEW  COMPARISON:  01/12/2013  FINDINGS: Monitor leads overlie the chest. Right axillary surgical clips noted. Normal  heart size and vascularity. No focal pneumonia, collapse or consolidation. Negative for edema, effusion or pneumothorax. Trachea midline. Stable exam.  IMPRESSION: Postop changes.  No definite acute process.   Electronically Signed   By: Daryll Brod M.D.   On: 09/17/2013 17:13     EKG Interpretation None      MDM   Final diagnoses:  Chest pain, unspecified chest pain type   63 y.o. F with known CAD and stent to Cx presenting with episode of weakness, nausea, and mid-sternal chest pain-- similar sx to prior NSTEMI.  Currently asx after ASA and NTG.  EKG NSR without acute ischemic changes.  Lab work and CXR pending at this time.  Lab work reassuring-- WBC count of 16K, non-specific.  Patient is afebrile, essentially normal exam, and without infectious source.  CXR clear and u/a without infection. Patient remains  asx in the ED.  Pt is moderate risk for MACE, HEART score of 4.  Case discussed with on call cardiology, Dr. Wynonia Lawman, who has evaluated patient and does not feel this represents ACS.  Recommends delta troponin and if negative, appropriate to d/c home with office FU.  Delta trop negative.  Patient has remained pain free and asx in the ED.  Low risk for ACS, PE, dissection, or other acute cardiac event.  Pt will be discharged home with close cardiology FU.  Discussed plan with patient, he/she acknowledged understanding and agreed with plan of care.  Return precautions given for new or worsening symptoms.  Larene Pickett, PA-C 09/18/13 [phone]

## 2013-09-18 NOTE — ED Provider Notes (Signed)
Medical screening examination/treatment/procedure(s) were performed by non-physician practitioner and as supervising physician I was immediately available for consultation/collaboration.   EKG Interpretation None        Francine Graven, DO 09/18/13 2345

## 2013-09-19 ENCOUNTER — Telehealth (INDEPENDENT_AMBULATORY_CARE_PROVIDER_SITE_OTHER): Payer: Self-pay | Admitting: General Surgery

## 2013-09-19 NOTE — Telephone Encounter (Signed)
Spoke with pt and informed her that Dr. Johnsie Cancel cleared her for surgery and that they want her to hold her Brilinta 5 days before surgery and then start it back ASAP after surgery.  Informed the patient that I send around her order forms on 09/17/13 and our surgery schedulers should be getting in contact with her in the next couple of days.  Instructed the patient to give [place] a call back if she hasnt heard from them by mid next week.  The patient verbalized understanding of all instructions that were given.

## 2013-10-14 ENCOUNTER — Encounter (HOSPITAL_COMMUNITY): Payer: Self-pay | Admitting: Pharmacy Technician

## 2013-10-15 ENCOUNTER — Encounter (HOSPITAL_COMMUNITY): Payer: Self-pay

## 2013-10-16 ENCOUNTER — Other Ambulatory Visit (HOSPITAL_COMMUNITY): Payer: Self-pay | Admitting: *Deleted

## 2013-10-16 NOTE — Patient Instructions (Addendum)
20     Your procedure is scheduled on:  Monday 10/27/2013  Report to [facility] Main Entrance and follow signs to Short Stay  at  0824 AM.  Call this number if you have problems the night before or morning of surgery:  [phone]   Remember:  [place] DR. THOMAS'S OFFICE ALONG WITH CLEAR LIQUID DIET!   CLEAR LIQUID DIET   Foods Allowed                                                                     Foods Excluded  Coffee and tea, regular and decaf                             liquids that you cannot  Plain Jell-O in any flavor                                             see through such as: Fruit ices (not with fruit pulp)                                     milk, soups, orange juice  Iced Popsicles                                    All solid food Carbonated beverages, regular and diet                                    Cranberry, grape and apple juices Sports drinks like Gatorade Lightly seasoned clear broth or consume(fat free) Sugar, honey syrup  Sample Menu Breakfast                                Lunch                                     Supper Cranberry juice                    Beef broth                            Chicken broth Jell-O                                     Grape juice                           Apple juice Coffee or tea  Jell-O                                      Popsicle                                                Coffee or tea                        Coffee or tea  _____________________________________________________________________            Do not eat food or drink liquids AFTER MIDNIGHT!  Take these medicines the morning of surgery with A SIP OF WATER: Metoprolol    [facility] IS NOT RESPONSIBLE FOR ANY BELONGINGS OR VALUABLES BROUGHT TO HOSPITAL.  Marland Kitchen  Leave suitcase in the car. After surgery it may be brought to your room.  For patients admitted to the hospital, checkout time is  11:00 AM the day of              Discharge.    DO NOT WEAR  JEWELRY,MAKE-UP,LOTIONS,POWDERS,PERFUMES,CONTACTS , DENTURES OR BRIDGEWORK ,AND DO NOT WEAR FALSE EYELASHES                                    Patients discharged the day of surgery will not be allowed to drive home.   If going home the same day of surgery, must have someone stay with you   first 24 hrs.at home and arrange for someone to drive you home from the [place]: N/A   Special Instructions:              Please read over the following fact sheets that you were given:             1. [place] [facility] - Preparing for Surgery Before surgery, you can play an important role.  Because skin is not sterile, your skin needs to be as free of germs as possible.  You can reduce the number of germs on your skin by washing with CHG (chlorahexidine gluconate) soap before surgery.  CHG is an antiseptic cleaner which kills germs and bonds with the skin to continue killing germs even after washing. Please DO NOT use if you have an allergy to CHG or antibacterial soaps.  If your skin becomes reddened/irritated stop using the CHG and inform your nurse when you arrive at Short Stay. Do not shave (including legs and underarms) for at least 48 hours prior to the first CHG shower.  You may shave your face/neck. Please follow these instructions carefully:  1.  Shower with CHG  Soap the night before surgery and the  morning of Surgery.  2.  If you choose to wash your hair, wash your hair first as usual with your  normal  shampoo.  3.  After you shampoo, rinse your hair and body thoroughly to remove the  shampoo.                           4.  Use CHG as you would any other liquid soap.  You can apply chg directly  to the skin and wash                       Gently with a scrungie or clean  washcloth.  5.  Apply the CHG Soap to your body ONLY FROM THE NECK DOWN.   Do not use on face/ open                           Wound or open sores. Avoid contact with eyes, ears mouth and genitals (private parts).                       Wash face,  Genitals (private parts) with your normal soap.             6.  Wash thoroughly, paying special attention to the area where your surgery  will be performed.  7.  Thoroughly rinse your body with warm water from the neck down.  8.  DO NOT shower/wash with your normal soap after using and rinsing off  the CHG Soap.                9.  Pat yourself dry with a clean towel.            10.  Wear clean pajamas.            11.  Place clean sheets on your bed the night of your first shower and do not  sleep with pets. Day of Surgery : Do not apply any lotions/deodorants the morning of surgery.  Please wear clean clothes to the hospital/surgery center.  FAILURE TO FOLLOW THESE INSTRUCTIONS MAY RESULT IN THE CANCELLATION OF YOUR SURGERY PATIENT SIGNATURE_________________________________  NURSE SIGNATURE__________________________________  ________________________________________________________________________   Adam Phenix  An incentive spirometer is a tool that can help keep your lungs clear and active. This tool measures how well you are filling your lungs with each breath. Taking long deep breaths may help reverse or decrease the chance of developing breathing (pulmonary) problems (especially infection) following:  A long period of time when you are unable to move or be active. BEFORE THE PROCEDURE   If the spirometer includes an indicator to show your best effort, your nurse or respiratory therapist will set it to a desired goal.  If possible, sit up straight or lean slightly forward. Try not to slouch.  Hold the incentive spirometer in an upright position. INSTRUCTIONS FOR USE  1. Sit on the edge of your bed if possible, or sit up as far  as you can in bed or on a chair. 2. Hold the incentive spirometer in an upright position. 3. Breathe out normally. 4. Place the mouthpiece in your mouth and seal your lips tightly around it. 5. Breathe in slowly and as deeply as possible, raising the piston or the ball toward the top of the column. 6. Hold your breath  for 3-5 seconds or for as long as possible. Allow the piston or ball to fall to the bottom of the column. 7. Remove the mouthpiece from your mouth and breathe out normally. 8. Rest for a few seconds and repeat Steps 1 through 7 at least 10 times every 1-2 hours when you are awake. Take your time and take a few normal breaths between deep breaths. 9. The spirometer may include an indicator to show your best effort. Use the indicator as a goal to work toward during each repetition. 10. After each set of 10 deep breaths, practice coughing to be sure your lungs are clear. If you have an incision (the cut made at the time of surgery), support your incision when coughing by placing a pillow or rolled up towels firmly against it. Once you are able to get out of bed, walk around indoors and cough well. You may stop using the incentive spirometer when instructed by your caregiver.  RISKS AND COMPLICATIONS  Take your time so you do not get dizzy or light-headed.  If you are in pain, you may need to take or ask for pain medication before doing incentive spirometry. It is harder to take a deep breath if you are having pain. AFTER USE  Rest and breathe slowly and easily.  It can be helpful to keep track of a log of your progress. Your caregiver can provide you with a simple table to help with this. If you are using the spirometer at home, follow these instructions: [place] IF:   You are having difficultly using the spirometer.  You have trouble using the spirometer as often as instructed.  Your pain medication is not giving enough relief while using the spirometer.  You  develop fever of 100.5 F (38.1 C) or higher. SEEK IMMEDIATE MEDICAL CARE IF:   You cough up bloody sputum that had not been present before.  You develop fever of 102 F (38.9 C) or greater.  You develop worsening pain at or near the incision site. MAKE SURE YOU:   Understand these instructions.  Will watch your condition.  Will get help right away if you are not doing well or get worse. Document Released: 06/12/2006 Document Revised: 04/24/2011 Document Reviewed: 08/13/2006 [facility] Patient Information 2014 [place], [place].   ________________________________________________________________________

## 2013-10-17 ENCOUNTER — Encounter (HOSPITAL_COMMUNITY): Payer: Self-pay

## 2013-10-17 ENCOUNTER — Encounter (HOSPITAL_COMMUNITY)
Admission: RE | Admit: 2013-10-17 | Discharge: 2013-10-17 | Disposition: A | Discharge status: Home / Self Care | Payer: 59 | Source: Ambulatory Visit | Attending: General Surgery | Admitting: General Surgery

## 2013-10-17 DIAGNOSIS — Z01818 Encounter for other preprocedural examination: Secondary | ICD-10-CM | POA: Diagnosis present

## 2013-10-17 DIAGNOSIS — N824 Other female intestinal-genital tract fistulae: Secondary | ICD-10-CM | POA: Diagnosis present

## 2013-10-17 DIAGNOSIS — K5730 Diverticulosis of large intestine without perforation or abscess without bleeding: Secondary | ICD-10-CM | POA: Insufficient documentation

## 2013-10-17 LAB — BASIC METABOLIC PANEL
Anion gap: 13 (ref 5–15)
BUN: 13 mg/dL (ref 6–23)
CALCIUM: 9.5 mg/dL (ref 8.4–10.5)
CHLORIDE: 100 meq/L (ref 96–112)
CO2: 27 mEq/L (ref 19–32)
CREATININE: 0.64 mg/dL (ref 0.50–1.10)
GFR calc Af Amer: >90 mL/min (ref >90)
GFR calc non Af Amer: >90 mL/min (ref >90)
Glucose, Bld: 160 mg/dL — ABNORMAL HIGH (ref 70–99)
Potassium: 4 mEq/L (ref 3.7–5.3)
Sodium: 140 mEq/L (ref 137–147)

## 2013-10-17 LAB — HEMOGLOBIN A1C
HEMOGLOBIN A1C: 6.5 % — AB (ref <5.7)
MEAN PLASMA GLUCOSE: 140 mg/dL — AB (ref <117)

## 2013-10-17 LAB — CBC
HEMATOCRIT: 38.1 % (ref 36.0–46.0)
HEMOGLOBIN: 12.3 g/dL (ref 12.0–15.0)
MCH: 29 pg (ref 26.0–34.0)
MCHC: 32.3 g/dL (ref 30.0–36.0)
MCV: 89.9 fL (ref 78.0–100.0)
Platelets: 447 10*3/uL — ABNORMAL HIGH (ref 150–400)
RBC: 4.24 MIL/uL (ref 3.87–5.11)
RDW: 13 % (ref 11.5–15.5)
WBC: 8.6 10*3/uL (ref 4.0–10.5)

## 2013-10-21 NOTE — Progress Notes (Signed)
Called the patient to inform her of these lab results and informed her that she will need to see her PCP this week and have them start working on lowering her blood sugars and Hgb A1C.  Patient verbalized understanding and agreed to try and get in today for an appt with them.

## 2013-10-27 ENCOUNTER — Encounter (HOSPITAL_COMMUNITY)
Admission: RE | Disposition: A | Discharge status: Home / Self Care | Payer: Self-pay | Source: Ambulatory Visit | Attending: General Surgery

## 2013-10-27 ENCOUNTER — Encounter (HOSPITAL_COMMUNITY): Payer: 59 | Admitting: Registered Nurse

## 2013-10-27 ENCOUNTER — Inpatient Hospital Stay (HOSPITAL_COMMUNITY)
Admission: RE | Admit: 2013-10-27 | Discharge: 2013-10-31 | DRG: 333 | Disposition: A | Discharge status: Home / Self Care | Payer: 59 | Source: Ambulatory Visit | Attending: General Surgery | Admitting: General Surgery

## 2013-10-27 ENCOUNTER — Inpatient Hospital Stay (HOSPITAL_COMMUNITY): Payer: 59 | Admitting: Registered Nurse

## 2013-10-27 ENCOUNTER — Encounter (HOSPITAL_COMMUNITY): Payer: Self-pay | Admitting: *Deleted

## 2013-10-27 DIAGNOSIS — Z79899 Other long term (current) drug therapy: Secondary | ICD-10-CM | POA: Diagnosis not present

## 2013-10-27 DIAGNOSIS — I252 Old myocardial infarction: Secondary | ICD-10-CM

## 2013-10-27 DIAGNOSIS — E785 Hyperlipidemia, unspecified: Secondary | ICD-10-CM | POA: Diagnosis present

## 2013-10-27 DIAGNOSIS — K5730 Diverticulosis of large intestine without perforation or abscess without bleeding: Principal | ICD-10-CM | POA: Diagnosis present

## 2013-10-27 DIAGNOSIS — Z7982 Long term (current) use of aspirin: Secondary | ICD-10-CM

## 2013-10-27 DIAGNOSIS — I2510 Atherosclerotic heart disease of native coronary artery without angina pectoris: Secondary | ICD-10-CM | POA: Diagnosis present

## 2013-10-27 DIAGNOSIS — I10 Essential (primary) hypertension: Secondary | ICD-10-CM | POA: Diagnosis present

## 2013-10-27 DIAGNOSIS — Z881 Allergy status to other antibiotic agents status: Secondary | ICD-10-CM

## 2013-10-27 DIAGNOSIS — N898 Other specified noninflammatory disorders of vagina: Secondary | ICD-10-CM | POA: Diagnosis present

## 2013-10-27 DIAGNOSIS — Z01812 Encounter for preprocedural laboratory examination: Secondary | ICD-10-CM

## 2013-10-27 DIAGNOSIS — Z853 Personal history of malignant neoplasm of breast: Secondary | ICD-10-CM

## 2013-10-27 DIAGNOSIS — N824 Other female intestinal-genital tract fistulae: Secondary | ICD-10-CM | POA: Diagnosis present

## 2013-10-27 DIAGNOSIS — Z888 Allergy status to other drugs, medicaments and biological substances status: Secondary | ICD-10-CM | POA: Diagnosis not present

## 2013-10-27 HISTORY — PX: PROCTOSCOPY: SHX2266

## 2013-10-27 LAB — GLUCOSE, CAPILLARY: GLUCOSE-CAPILLARY: 118 mg/dL — AB (ref 70–99)

## 2013-10-27 SURGERY — COLECTOMY, PARTIAL, ROBOT-ASSISTED, LAPAROSCOPIC
Anesthesia: General | Site: Rectum

## 2013-10-27 MED ORDER — ENOXAPARIN SODIUM 40 MG/0.4ML ~~LOC~~ SOLN
40.0000 mg | SUBCUTANEOUS | Status: DC
  Administered 2013-10-28 – 2013-10-31 (×4): 40 mg via SUBCUTANEOUS
  Filled 2013-10-27 (×5): qty 0.4

## 2013-10-27 MED ORDER — DEXAMETHASONE SODIUM PHOSPHATE 10 MG/ML IJ SOLN
INTRAMUSCULAR | Status: DC | PRN
  Administered 2013-10-27: 10 mg via INTRAVENOUS

## 2013-10-27 MED ORDER — ROCURONIUM BROMIDE 100 MG/10ML IV SOLN
INTRAVENOUS | Status: DC | PRN
  Administered 2013-10-27: 60 mg via INTRAVENOUS
  Administered 2013-10-27: 10 mg via INTRAVENOUS

## 2013-10-27 MED ORDER — HYDROMORPHONE HCL PF 2 MG/ML IJ SOLN
INTRAMUSCULAR | Status: AC
  Filled 2013-10-27: qty 1

## 2013-10-27 MED ORDER — ONDANSETRON HCL 4 MG/2ML IJ SOLN
INTRAMUSCULAR | Status: AC
  Filled 2013-10-27: qty 2

## 2013-10-27 MED ORDER — DIPHENHYDRAMINE HCL 50 MG/ML IJ SOLN: 12.5000 mg | Freq: Four times a day (QID) | INTRAMUSCULAR | Status: DC | PRN

## 2013-10-27 MED ORDER — CHLORHEXIDINE GLUCONATE 0.12 % MT SOLN
15.0000 mL | Freq: Two times a day (BID) | OROMUCOSAL | Status: DC
  Administered 2013-10-27 – 2013-10-30 (×5): 15 mL via OROMUCOSAL
  Filled 2013-10-27 (×8): qty 15

## 2013-10-27 MED ORDER — ACETAMINOPHEN 500 MG PO TABS
1000.0000 mg | ORAL_TABLET | Freq: Four times a day (QID) | ORAL | Status: AC
  Administered 2013-10-28: 1000 mg via ORAL
  Filled 2013-10-27 (×2): qty 2

## 2013-10-27 MED ORDER — MORPHINE SULFATE (PF) 1 MG/ML IV SOLN
INTRAVENOUS | Status: DC
  Administered 2013-10-27: 18 mg via INTRAVENOUS
  Administered 2013-10-27: 4.5 mg via INTRAVENOUS
  Administered 2013-10-27 (×2): via INTRAVENOUS
  Administered 2013-10-28: 2 mg via INTRAVENOUS
  Administered 2013-10-28: 12:00:00 via INTRAVENOUS
  Administered 2013-10-28: 3 mg via INTRAVENOUS
  Administered 2013-10-28: 6 mg via INTRAVENOUS
  Administered 2013-10-29: 2.5 mg via INTRAVENOUS
  Administered 2013-10-29: 4.5 mg via INTRAVENOUS
  Filled 2013-10-27 (×2): qty 25

## 2013-10-27 MED ORDER — FENTANYL CITRATE 0.05 MG/ML IJ SOLN
INTRAMUSCULAR | Status: DC | PRN
  Administered 2013-10-27 (×2): 50 ug via INTRAVENOUS
  Administered 2013-10-27: 100 ug via INTRAVENOUS
  Administered 2013-10-27: 50 ug via INTRAVENOUS

## 2013-10-27 MED ORDER — MORPHINE SULFATE (PF) 1 MG/ML IV SOLN
INTRAVENOUS | Status: AC
  Filled 2013-10-27: qty 25

## 2013-10-27 MED ORDER — 0.9 % SODIUM CHLORIDE (POUR BTL) OPTIME
TOPICAL | Status: DC | PRN
  Administered 2013-10-27: 1000 mL

## 2013-10-27 MED ORDER — PROPOFOL 10 MG/ML IV BOLUS
INTRAVENOUS | Status: DC | PRN
  Administered 2013-10-27: 100 mg via INTRAVENOUS
  Administered 2013-10-27 (×2): 50 mg via INTRAVENOUS

## 2013-10-27 MED ORDER — NALOXONE HCL 0.4 MG/ML IJ SOLN: 0.4000 mg | INTRAMUSCULAR | Status: DC | PRN

## 2013-10-27 MED ORDER — SODIUM CHLORIDE 0.9 % IJ SOLN: 9.0000 mL | INTRAMUSCULAR | Status: DC | PRN

## 2013-10-27 MED ORDER — CEFOTETAN DISODIUM-DEXTROSE 2-2.08 GM-% IV SOLR
INTRAVENOUS | Status: AC
  Filled 2013-10-27: qty 50

## 2013-10-27 MED ORDER — CETYLPYRIDINIUM CHLORIDE 0.05 % MT LIQD
7.0000 mL | Freq: Two times a day (BID) | OROMUCOSAL | Status: DC
  Administered 2013-10-28 (×2): 7 mL via OROMUCOSAL

## 2013-10-27 MED ORDER — EPHEDRINE SULFATE 50 MG/ML IJ SOLN
INTRAMUSCULAR | Status: DC | PRN
  Administered 2013-10-27 (×2): 2.5 mg via INTRAVENOUS

## 2013-10-27 MED ORDER — ALVIMOPAN 12 MG PO CAPS
12.0000 mg | ORAL_CAPSULE | Freq: Once | ORAL | Status: AC
  Administered 2013-10-27: 12 mg via ORAL
  Filled 2013-10-27: qty 1

## 2013-10-27 MED ORDER — LIDOCAINE HCL (CARDIAC) 20 MG/ML IV SOLN
INTRAVENOUS | Status: DC | PRN
  Administered 2013-10-27: 50 mg via INTRAVENOUS

## 2013-10-27 MED ORDER — GLYCOPYRROLATE 0.2 MG/ML IJ SOLN
INTRAMUSCULAR | Status: DC | PRN
  Administered 2013-10-27: 0.4 mg via INTRAVENOUS

## 2013-10-27 MED ORDER — NEOSTIGMINE METHYLSULFATE 10 MG/10ML IV SOLN
INTRAVENOUS | Status: DC | PRN
  Administered 2013-10-27: 3 mg via INTRAVENOUS

## 2013-10-27 MED ORDER — DEXAMETHASONE SODIUM PHOSPHATE 10 MG/ML IJ SOLN
INTRAMUSCULAR | Status: AC
  Filled 2013-10-27: qty 1

## 2013-10-27 MED ORDER — GLYCOPYRROLATE 0.2 MG/ML IJ SOLN
INTRAMUSCULAR | Status: AC
  Filled 2013-10-27: qty 2

## 2013-10-27 MED ORDER — NEOSTIGMINE METHYLSULFATE 10 MG/10ML IV SOLN
INTRAVENOUS | Status: AC
  Filled 2013-10-27: qty 1

## 2013-10-27 MED ORDER — LIDOCAINE HCL (CARDIAC) 20 MG/ML IV SOLN
INTRAVENOUS | Status: AC
  Filled 2013-10-27: qty 5

## 2013-10-27 MED ORDER — ONDANSETRON HCL 4 MG/2ML IJ SOLN: 4.0000 mg | Freq: Four times a day (QID) | INTRAMUSCULAR | Status: DC | PRN

## 2013-10-27 MED ORDER — ROCURONIUM BROMIDE 100 MG/10ML IV SOLN
INTRAVENOUS | Status: AC
  Filled 2013-10-27: qty 1

## 2013-10-27 MED ORDER — EZETIMIBE 10 MG PO TABS
10.0000 mg | ORAL_TABLET | Freq: Every morning | ORAL | Status: DC
  Administered 2013-10-28 – 2013-10-30 (×3): 10 mg via ORAL
  Filled 2013-10-27 (×4): qty 1

## 2013-10-27 MED ORDER — HYDROMORPHONE HCL PF 1 MG/ML IJ SOLN
INTRAMUSCULAR | Status: DC | PRN
  Administered 2013-10-27: .4 mg via INTRAVENOUS

## 2013-10-27 MED ORDER — BUPIVACAINE-EPINEPHRINE 0.25% -1:200000 IJ SOLN
INTRAMUSCULAR | Status: AC
  Filled 2013-10-27: qty 1

## 2013-10-27 MED ORDER — NITROGLYCERIN 0.4 MG SL SUBL: 0.4000 mg | SUBLINGUAL_TABLET | SUBLINGUAL | Status: DC | PRN

## 2013-10-27 MED ORDER — ONDANSETRON HCL 4 MG/2ML IJ SOLN
INTRAMUSCULAR | Status: DC | PRN
  Administered 2013-10-27: 4 mg via INTRAVENOUS

## 2013-10-27 MED ORDER — DIPHENHYDRAMINE HCL 12.5 MG/5ML PO ELIX: 12.5000 mg | ORAL_SOLUTION | Freq: Four times a day (QID) | ORAL | Status: DC | PRN

## 2013-10-27 MED ORDER — HEPARIN SODIUM (PORCINE) 5000 UNIT/ML IJ SOLN
5000.0000 [IU] | Freq: Once | INTRAMUSCULAR | Status: AC
  Administered 2013-10-27: 5000 [IU] via SUBCUTANEOUS
  Filled 2013-10-27: qty 1

## 2013-10-27 MED ORDER — BUPIVACAINE-EPINEPHRINE (PF) 0.25% -1:200000 IJ SOLN
INTRAMUSCULAR | Status: DC | PRN
  Administered 2013-10-27: 25 mL

## 2013-10-27 MED ORDER — LACTATED RINGERS IV SOLN
INTRAVENOUS | Status: DC
  Administered 2013-10-27: 1000 mL via INTRAVENOUS

## 2013-10-27 MED ORDER — FENTANYL CITRATE 0.05 MG/ML IJ SOLN
INTRAMUSCULAR | Status: AC
  Filled 2013-10-27: qty 5

## 2013-10-27 MED ORDER — MIDAZOLAM HCL 2 MG/2ML IJ SOLN
INTRAMUSCULAR | Status: AC
  Filled 2013-10-27: qty 2

## 2013-10-27 MED ORDER — PROPOFOL 10 MG/ML IV BOLUS
INTRAVENOUS | Status: AC
  Filled 2013-10-27: qty 20

## 2013-10-27 MED ORDER — LACTATED RINGERS IV SOLN
INTRAVENOUS | Status: DC
Start: 2013-10-27 — End: 2013-10-29
  Administered 2013-10-27 – 2013-10-28 (×3): via INTRAVENOUS

## 2013-10-27 MED ORDER — DEXTROSE 5 % IV SOLN
2.0000 g | INTRAVENOUS | Status: AC
  Administered 2013-10-27: 2 g via INTRAVENOUS
  Filled 2013-10-27: qty 2

## 2013-10-27 MED ORDER — METOPROLOL TARTRATE 25 MG PO TABS
25.0000 mg | ORAL_TABLET | Freq: Two times a day (BID) | ORAL | Status: DC
  Administered 2013-10-28 – 2013-10-30 (×7): 25 mg via ORAL
  Filled 2013-10-27 (×9): qty 1

## 2013-10-27 MED ORDER — LACTATED RINGERS IR SOLN
Status: DC | PRN
  Administered 2013-10-27: 2000 mL

## 2013-10-27 MED ORDER — MIDAZOLAM HCL 5 MG/5ML IJ SOLN
INTRAMUSCULAR | Status: DC | PRN
  Administered 2013-10-27 (×2): 1 mg via INTRAVENOUS

## 2013-10-27 MED ORDER — SUCCINYLCHOLINE CHLORIDE 20 MG/ML IJ SOLN
INTRAMUSCULAR | Status: DC | PRN
  Administered 2013-10-27: 100 mg via INTRAVENOUS

## 2013-10-27 SURGICAL SUPPLY — 117 items
BLADE EXTENDED COATED 6.5IN (ELECTRODE) IMPLANT
BLADE HEX COATED 2.75 (ELECTRODE) IMPLANT
BLADE SURG 15 STRL LF DISP TIS (BLADE) ×2 IMPLANT
BLADE SURG 15 STRL SS (BLADE) ×2
BLADE SURG SZ10 CARB STEEL (BLADE) ×4 IMPLANT
CABLE HIGH FREQUENCY MONO STRZ (ELECTRODE) ×4 IMPLANT
CANISTER SUCTION 2500CC (MISCELLANEOUS) ×4 IMPLANT
CANNULA REDUC XI 12-8 STAPL (CANNULA)
CANNULA REDUC XI 12-8MM STAPL (CANNULA)
CANNULA REDUCER 12-8 DVNC XI (CANNULA) IMPLANT
CELLS DAT CNTRL 66122 CELL SVR (MISCELLANEOUS) IMPLANT
CHLORAPREP W/TINT 26ML (MISCELLANEOUS) ×4 IMPLANT
CLIP LIGATING HEM O LOK PURPLE (MISCELLANEOUS) IMPLANT
CLIP LIGATING HEMOLOK MED (MISCELLANEOUS) IMPLANT
COVER MAYO STAND STRL (DRAPES) ×4 IMPLANT
COVER TIP SHEARS 8 DVNC (MISCELLANEOUS) ×2 IMPLANT
COVER TIP SHEARS 8MM DA VINCI (MISCELLANEOUS) ×2
DECANTER SPIKE VIAL GLASS SM (MISCELLANEOUS) ×4 IMPLANT
DERMABOND ADVANCED (GAUZE/BANDAGES/DRESSINGS) ×2
DERMABOND ADVANCED .7 DNX12 (GAUZE/BANDAGES/DRESSINGS) ×2 IMPLANT
DEVICE TROCAR PUNCTURE CLOSURE (ENDOMECHANICALS) IMPLANT
DRAIN CHANNEL 19F RND (DRAIN) IMPLANT
DRAPE ARM DVNC X/XI (DISPOSABLE) ×8 IMPLANT
DRAPE COLUMN DVNC XI (DISPOSABLE) ×2 IMPLANT
DRAPE DA VINCI XI ARM (DISPOSABLE) ×8
DRAPE DA VINCI XI COLUMN (DISPOSABLE) ×2
DRAPE LG THREE QUARTER DISP (DRAPES) ×4 IMPLANT
DRAPE SURG IRRIG POUCH 19X23 (DRAPES) ×4 IMPLANT
DRAPE WARM FLUID 44X44 (DRAPE) IMPLANT
DRSG OPSITE POSTOP 4X10 (GAUZE/BANDAGES/DRESSINGS) IMPLANT
DRSG OPSITE POSTOP 4X6 (GAUZE/BANDAGES/DRESSINGS) ×4 IMPLANT
DRSG OPSITE POSTOP 4X8 (GAUZE/BANDAGES/DRESSINGS) IMPLANT
DRSG PAD ABDOMINAL 8X10 ST (GAUZE/BANDAGES/DRESSINGS) IMPLANT
ELECT REM PT RETURN 9FT ADLT (ELECTROSURGICAL) ×4
ELECTRODE REM PT RTRN 9FT ADLT (ELECTROSURGICAL) ×2 IMPLANT
ENDOLOOP SUT PDS II  0 18 (SUTURE)
ENDOLOOP SUT PDS II 0 18 (SUTURE) IMPLANT
EVACUATOR DRAINAGE 10X20 100CC (DRAIN) IMPLANT
EVACUATOR SILICONE 100CC (DRAIN)
GAUZE SPONGE 4X4 12PLY STRL (GAUZE/BANDAGES/DRESSINGS) ×4 IMPLANT
GAUZE SPONGE 4X4 16PLY XRAY LF (GAUZE/BANDAGES/DRESSINGS) ×4 IMPLANT
GLOVE BIO SURGEON STRL SZ 6.5 (GLOVE) ×9 IMPLANT
GLOVE BIO SURGEON STRL SZ7.5 (GLOVE) ×4 IMPLANT
GLOVE BIO SURGEONS STRL SZ 6.5 (GLOVE) ×3
GLOVE BIOGEL PI IND STRL 7.0 (GLOVE) ×12 IMPLANT
GLOVE BIOGEL PI INDICATOR 7.0 (GLOVE) ×12
GLOVE ECLIPSE 8.0 STRL XLNG CF (GLOVE) ×12 IMPLANT
GLOVE INDICATOR 8.0 STRL GRN (GLOVE) ×12 IMPLANT
GLOVE SURG SS PI 6.5 STRL IVOR (GLOVE) ×12 IMPLANT
GLOVE SURG SS PI 7.0 STRL IVOR (GLOVE) ×8 IMPLANT
GOWN SPEC L4 XLG W/TWL (GOWN DISPOSABLE) ×24 IMPLANT
GOWN STRL REUS W/TWL 2XL LVL3 (GOWN DISPOSABLE) ×12 IMPLANT
GOWN STRL REUS W/TWL XL LVL3 (GOWN DISPOSABLE) ×20 IMPLANT
KIT BASIN OR (CUSTOM PROCEDURE TRAY) ×8 IMPLANT
LEGGING LITHOTOMY PAIR STRL (DRAPES) ×4 IMPLANT
NEEDLE HYPO 22GX1.5 SAFETY (NEEDLE) ×4 IMPLANT
NEEDLE INSUFFLATION 14GA 120MM (NEEDLE) ×4 IMPLANT
PACK CARDIOVASCULAR III (CUSTOM PROCEDURE TRAY) ×4 IMPLANT
PACK GENERAL/GYN (CUSTOM PROCEDURE TRAY) ×4 IMPLANT
PENCIL BUTTON HOLSTER BLD 10FT (ELECTRODE) ×4 IMPLANT
PORT LAP GEL ALEXIS MED 5-9CM (MISCELLANEOUS) ×4 IMPLANT
RTRCTR WOUND ALEXIS 18CM MED (MISCELLANEOUS)
SCISSORS LAP 5X35 DISP (ENDOMECHANICALS) IMPLANT
SEAL CANN UNIV 5-8 DVNC XI (MISCELLANEOUS) ×16 IMPLANT
SEAL XI 5MM-8MM UNIVERSAL (MISCELLANEOUS) ×16
SEALER TISSUE G2 STRG ARTC 35C (ENDOMECHANICALS) IMPLANT
SEALER VESSEL DA VINCI XI (MISCELLANEOUS) ×4
SEALER VESSEL EXT DVNC XI (MISCELLANEOUS) ×4 IMPLANT
SET IRRIG TUBING LAPAROSCOPIC (IRRIGATION / IRRIGATOR) ×4 IMPLANT
SET TUBE IRRIG SUCTION NO TIP (IRRIGATION / IRRIGATOR) ×4 IMPLANT
SLEEVE XCEL OPT CAN 5 100 (ENDOMECHANICALS) IMPLANT
SOLUTION ANTI FOG 6CC (MISCELLANEOUS) ×4 IMPLANT
SOLUTION ELECTROLUBE (MISCELLANEOUS) ×4 IMPLANT
SPONGE DRAIN TRACH 4X4 STRL 2S (GAUZE/BANDAGES/DRESSINGS) ×4 IMPLANT
SPONGE LAP 18X18 X RAY DECT (DISPOSABLE) ×8 IMPLANT
STAPLER 45 BLU RELOAD XI (STAPLE) ×4 IMPLANT
STAPLER 45 BLUE RELOAD XI (STAPLE) ×4
STAPLER CANNULA SEAL DVNC XI (STAPLE) ×2 IMPLANT
STAPLER CANNULA SEAL XI (STAPLE) ×2
STAPLER CIRC CVD 29MM 37CM (STAPLE) ×4 IMPLANT
STAPLER SHEATH (SHEATH) ×2
STAPLER SHEATH ENDOWRIST DVNC (SHEATH) ×2 IMPLANT
STAPLER VISISTAT 35W (STAPLE) ×4 IMPLANT
SUCTION POOLE TIP (SUCTIONS) ×4 IMPLANT
SUT CHROMIC 2 0 SH (SUTURE) IMPLANT
SUT CHROMIC 3 0 SH 27 (SUTURE) IMPLANT
SUT ETHILON 2 0 PS N (SUTURE) ×4 IMPLANT
SUT NOVA NAB DX-16 0-1 5-0 T12 (SUTURE) ×12 IMPLANT
SUT PDS AB 1 CTX 36 (SUTURE) IMPLANT
SUT PDS AB 1 TP1 96 (SUTURE) IMPLANT
SUT PROLENE 0 CT 2 (SUTURE) IMPLANT
SUT PROLENE 2 0 KS (SUTURE) ×8 IMPLANT
SUT SILK 2 0 (SUTURE) ×2
SUT SILK 2 0 SH CR/8 (SUTURE) ×4 IMPLANT
SUT SILK 2-0 18XBRD TIE 12 (SUTURE) ×2 IMPLANT
SUT SILK 3 0 (SUTURE) ×2
SUT SILK 3 0 SH CR/8 (SUTURE) ×4 IMPLANT
SUT SILK 3-0 18XBRD TIE 12 (SUTURE) ×2 IMPLANT
SUT VIC AB 2-0 SH 18 (SUTURE) ×8 IMPLANT
SUT VIC AB 2-0 SH 27 (SUTURE) ×2
SUT VIC AB 2-0 SH 27X BRD (SUTURE) ×2 IMPLANT
SUT VIC AB 4-0 PS2 27 (SUTURE) ×16 IMPLANT
SUT VIC AB 4-0 SH 18 (SUTURE) IMPLANT
SUT VICRYL 0 UR6 27IN ABS (SUTURE) ×4 IMPLANT
SYR 20CC LL (SYRINGE) ×4 IMPLANT
SYR CONTROL 10ML LL (SYRINGE) ×4 IMPLANT
SYS LAPSCP GELPORT 120MM (MISCELLANEOUS)
SYSTEM LAPSCP GELPORT 120MM (MISCELLANEOUS) IMPLANT
TOWEL OR 17X26 10 PK STRL BLUE (TOWEL DISPOSABLE) ×8 IMPLANT
TOWEL OR NON WOVEN STRL DISP B (DISPOSABLE) ×8 IMPLANT
TRAY FOLEY CATH 14FRSI W/METER (CATHETERS) ×4 IMPLANT
TROCAR BLADELESS OPT 5 100 (ENDOMECHANICALS) ×4 IMPLANT
TUBING CONNECTING 10 (TUBING) IMPLANT
TUBING CONNECTING 10' (TUBING)
TUBING FILTER THERMOFLATOR (ELECTROSURGICAL) ×4 IMPLANT
WATER STERILE IRR 1000ML POUR (IV SOLUTION) IMPLANT
YANKAUER SUCT BULB TIP 10FT TU (MISCELLANEOUS) ×4 IMPLANT

## 2013-10-27 NOTE — H&P (Signed)
Chief Complaint   Patient presents with   .  eval fistula   HISTORY:  Ashley Conley is a 63 y.o. female who presents to the office with vaginal drainage since March. She saw her Gyn about it in May who saw a area of granulation tissue in R apex of vagina. Colovaginal communication was noted. Her last colonoscopy was in 2013 and was normal per pt. This was done for chronic RLQ pain. She is s/p bilateral TRAM flaps for breast cancer 25 yrs ago. She had a large piece of abdominal mesh placed during this surgery.  Past Medical History   Diagnosis  Date   .  Breast cancer    .  Hypertension    .  Hyperlipidemia      Statins caused to elevated LFTs   .  NSTEMI (non-ST elevated myocardial infarction)    .  CAD (coronary artery disease)     Past Surgical History   Procedure  Laterality  Date   .  Abdominal hysterectomy     .  Reconstruction breast w/ tram flap       1992   .  Breast surgery     .  Liver biopsy       Performed for abnormal LFTs in the setting of statin use   .  Bilateral salpingooophoroectomy  Bilateral  2005    Current Outpatient Prescriptions   Medication  Sig  Dispense  Refill   .  aspirin 81 MG tablet  Take 1 tablet (81 mg total) by mouth daily.     .  Coenzyme Q10 (CO Q 10 PO)  Take 1 capsule by mouth daily.     Marland Kitchen  dicyclomine (BENTYL) 10 MG capsule  Take 10 mg by mouth daily.     Marland Kitchen  ezetimibe (ZETIA) 10 MG tablet  Take 1 tablet (10 mg total) by mouth daily.  30 tablet  11   .  metoprolol tartrate (LOPRESSOR) 25 MG tablet  Take 1 tablet (25 mg total) by mouth 2 (two) times daily.  60 tablet  11   .  nitroGLYCERIN (NITROSTAT) 0.4 MG SL tablet  Place 1 tablet (0.4 mg total) under the tongue every 5 (five) minutes as needed for chest pain.  25 tablet  3   .  omega-3 acid ethyl esters (LOVAZA) 1 G capsule  Take 1 g by mouth daily.     .  Ticagrelor (BRILINTA) 90 MG TABS tablet  Take 1 tablet (90 mg total) by mouth 2 (two) times daily.  60 tablet  11   .  metroNIDAZOLE  (FLAGYL) 500 MG tablet  Take 1 tablet (500 mg total) by mouth as directed.  6 tablet  0   .  neomycin (MYCIFRADIN) 500 MG tablet  Take 2 tablets (1,000 mg total) by mouth as directed.  6 tablet  0    No current facility-administered medications for this visit.    Allergies   Allergen  Reactions   .  Statins  Other (See Comments)     Liver failure from previously taking   .  Advil [Ibuprofen]  Itching   .  Sulfa Antibiotics  Nausea And Vomiting   No family history on file.  History    Social History   .  Marital Status:  Married     Spouse Name:  N/A     Number of Children:  N/A   .  Years of Education:  N/A    Social History Main  Topics   .  Smoking status:  Never Smoker   .  Smokeless tobacco:  None   .  Alcohol Use:  No   .  Drug Use:  No   .  Sexual Activity:  Yes     Birth Control/ Protection:  Post-menopausal    Other Topics  Concern   .  None    Social History Narrative   .  None   REVIEW OF SYSTEMS - PERTINENT POSITIVES ONLY:  Review of Systems - General ROS: negative for - chills or fever  Respiratory ROS: no cough, shortness of breath, or wheezing  Cardiovascular ROS: no chest pain or dyspnea on exertion  Gastrointestinal ROS: no abdominal pain, change in bowel habits, or black or bloody stools  EXAM:  Filed Vitals:   10/27/13 0828  BP: 130/74  Pulse: 75  Temp: 97.3 F (36.3 C)  Resp: 16   Gen: No acute distress. Well nourished and well groomed.  Neurological: Alert and oriented to person, place, and time. Coordination normal.  Head: Normocephalic and atraumatic.  Eyes: Conjunctivae are normal. Pupils are equal, round, and reactive to light. No scleral icterus.  Neck: Normal range of motion. Neck supple. No tracheal deviation or thyromegaly present. No cervical lymphadenopathy.  Cardiovascular: Normal rate, regular rhythm, normal heart sounds and intact distal pulses.  Respiratory: Effort normal. No respiratory distress. No chest wall tenderness. Breath  sounds normal. No wheezes, rales or rhonchi.  GI: Soft. Bowel sounds are normal. The abdomen is soft and nontender. There is no rebound and no guarding.  Musculoskeletal: Normal range of motion. Extremities are nontender.  Skin: Skin is warm and dry. No rash noted. No diaphoresis. No erythema. No pallor. No clubbing, cyanosis, or edema.  Psychiatric: Normal mood and affect. Behavior is normal. Judgment and thought content normal.  LABORATORY RESULTS:  Available labs are reviewed  RADIOLOGY RESULTS:  Images and reports are reviewed.  CT Abd and pelvis IMPRESSION:  Soft tissue thickening of the descending colon. There is irregular  soft tissue extending from the sigmoid colon to the right lateral  aspect of the vagina with surrounding fat stranding. No definite  contrast is demonstrated coursing from the sigmoid colon/rectum into  the vagina on this examination. 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 Further examination with  fluoroscopic study could be performed as clinically indicated.  Soft tissue thickening and surrounding fat stranding is favored to  be inflammatory from possible fistulous connection and prior  episodes of diverticulitis. Underlying malignant etiology would not  be entirely excluded.  ASSESSMENT AND PLAN:  Ashley Conley is a 63 y.o. 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 She is s/p colonoscopy ~2 yrs ago. We will obtain these records. We discussed a robotic sigmoidectomy to remedy her problem. I will ask Dr Johnsie Cancel to evaluate for cardiac risk stratification and what to do with her anticoagulation around surgery. I would also like to perform a sigmoidoscopy on the morning of surgery to evaluate her colonic mucosa as well.  The surgery and anatomy were described to the patient as well as the risks of surgery and the possible complications. These include: Bleeding,  deep abdominal infections and possible wound complications such as hernia and infection, damage to adjacent structures, leak of surgical connections, which can lead to other surgeries and possibly an ostomy, possible need for other procedures, such as abscess drains in radiology,  possible prolonged hospital stay, possible diarrhea from removal of part of the colon, possible constipation from narcotics, prolonged fatigue/weakness or appetite loss, possible early recurrence of of disease, possible complications of their medical problems such as heart disease or arrhythmias or lung problems, death (less than 1%). I believe the patient understands and wishes to proceed with the surgery.  Rosario Adie, MD  Colon and Rectal Surgery / [facility]

## 2013-10-27 NOTE — Transfer of Care (Signed)
Immediate Anesthesia Transfer of Care Note  Patient: Ashley Conley  Procedure(s) Performed: Procedure(s): XI ROBOT ASSISTED LAPAROSCOPIC LOW ANTERIOR RESECTION (N/A) RIGID PROCTOSCOPY (N/A)  Patient Location: PACU  Anesthesia Type:General  Level of Consciousness: sedated  Airway & Oxygen Therapy: Patient Spontanous Breathing and Patient connected to face mask oxygen  Post-op Assessment: Report given to PACU RN and Post -op Vital signs reviewed and stable  Post vital signs: Reviewed and stable  Complications: No apparent anesthesia complications

## 2013-10-27 NOTE — Op Note (Signed)
10/27/2013  2:59 PM  PATIENT:  Ashley Conley  63 y.o. female  Patient Care Team: Imagene Riches, NP as PCP - General  PRE-OPERATIVE DIAGNOSIS:  Diverticular disease, colovaginal fistula  POST-OPERATIVE DIAGNOSIS:  Diverticular disease, colovaginal fistula  PROCEDURE:  ROBOT ASSISTED LOW ANTERIOR RESECTION RIGID PROCTOSCOPY  SURGEON:  Surgeon(s): Leighton Ruff, MD Michael Boston, MD Ralene Ok, MD  ASSISTANT: Johney [place]   ANESTHESIA:   local and general  EBL:  Total I/O In: 1000 [I.V.:1000] Out: 100 [Urine:50; Blood:50]  Delay start of Pharmacological VTE agent (>24hrs) due to surgical blood loss or risk of bleeding:  no  DRAINS: (38F) Jackson-Pratt drain(s) with closed bulb suction in the pelvis   SPECIMEN:  Source of Specimen:  Rectosigmoid  DISPOSITION OF SPECIMEN:  PATHOLOGY  COUNTS:  YES  PLAN OF CARE: Admit to inpatient   PATIENT DISPOSITION:  PACU - hemodynamically stable.  INDICATION:    Patient presented to the office with a colovaginal fistula noted on CT with rectal contrast.  Colonoscopy was negative for a mass or cancer.  I recommended segmental resection:  The anatomy & physiology of the digestive tract was discussed.  The pathophysiology was discussed.  Natural history risks without surgery was discussed.   I worked to give an overview of the disease and the frequent need to have multispecialty involvement.  I feel the risks of no intervention will lead to serious problems that outweigh the operative risks; therefore, I recommended a partial colectomy to remove the pathology.  Laparoscopic & open techniques were discussed.   Risks such as bleeding, infection, abscess, leak, reoperation, possible ostomy, hernia, heart attack, death, and other risks were discussed.  I noted a good likelihood this will help address the problem.   Goals of post-operative recovery were discussed as well.    The patient expressed understanding & wished to proceed with  surgery.  OR FINDINGS:   Patient had colovaginal fistula to the right side of the vaginal apex.  Proximal rectum and cecum involved in inflammation.  The anastomosis rests ~9cm cm from the anal verge by rigid proctoscopy.  DESCRIPTION:   Informed consent was confirmed.  The patient underwent general anaesthesia without difficulty.  The patient was positioned appropriately.  VTE prevention in place.  The patient's abdomen was clipped, prepped, & draped in a sterile fashion.  Surgical timeout confirmed our plan.  The patient was positioned in reverse Trendelenburg.  Abdominal entry was gained using Veress needle in the LUQ in reverse trendelenburg and right side down.  Entry was clean.  I induced carbon dioxide insufflation.  Camera inspection revealed no injury.  Extra ports were carefully placed under direct laparoscopic visualization.  The robot was docked to the patient's left side and connected to the robotic ports.   I reflected the greater omentum and the upper abdomen the small bowel in the upper abdomen. I scored the base of peritoneum of the right side of the mesentery of the left colon from the ligament of Treitz to the peritoneal reflection of the mid rectum.  The patient had a large amount of inflammation in the pelvis.  The cecum and sigmoid were inflamed and adherent to the anterior pelvis. I mobilized the cecum from the abscess cavity using sharp dissection.  I then mobilized the left colon off of the lateral side wall.  I identified the left ureter and preserved this.  I elevated the sigmoid mesentery and enetered into the retro-mesenteric plane. We were able to identify the left ureter  again and gonadal vessels. We kept those posterior within the retroperitoneum and elevated the left colon mesentery off that. I did isolated Sigmoid and hemorrhoidal arteries and ligated them separately using a vessel sealer. I ligated the vein in similar fashion.  I continued distally and got into the  avascular plane posterior to the mesorectum. This allowed me to help mobilize the rectum as well by freeing the mesorectum off the sacrum.  I mobilized the peritoneal coverings towards the peritoneal reflection on both the right and left sides of the rectum.  I carefully dissected the coiled sigmoid and abscess cavity from the pelvis.  Blunt and sharp dissection were used to remove from the posterior vaginal wall.    I could see the right and left ureters and stayed away from them. After everything was dissected out of the pelvis, I identified a portion of healthy rectum and divided its mesentery posteriorly.  I then used 2 blue load robotic stapler to divide the rectum.    We ensured hemostasis. I skeletonized the mesorectum at the junction at the proximal rectum using blunt dissection & bipolar EnSeal.  I mobilized the left colon in a lateral to medial fashion off the line of Toldt up towards the splenic flexure to ensure good mobilization of the left colon to reach into the pelvis.  I then made a lower midline transverse pelvic incision through her previous incision and mesh.  An [place] wound protector was placed.  The colon was removed and the proximal end was transected.  There was good mucosal bleeding noted.  The specimen was sent to pathology for further evaluation.  I placed a 2-0 prolene pursestring using the pursestringer device.  This was secured with 3-0 silk sutures.  The suture was closed over a 87mm EEA anvil.  This was placed into the abdomen and the cap was placed and the abdomen was re-insufflated.  The anastomosis was created at ~9cm from the anal verge under no tension.  There was no leak with insufflation under water.  A 11F blake drain was placed into the pelvis.  This was brought out through the 58mm port site and secured.  The omentum was brought down into the pelvis.  The abdomen was desufflated.  The fascia of all infraumbilical ports were closed using a 0 vicryl suture.  The extraction  site fascia was closed using 0 Novofil interrupted sutures.  The subcutaneous tissues were closed with 2-0 Vicryl sutures and the skin was closed with a 4-0 vicryl suture.  The extraction site was covered with a sterile dressing and the port sites were closed with dermabond.  The patient was awakened from anesthesia and sent to the PACU in stable condition.  All counts were correct per OR staff.

## 2013-10-27 NOTE — Anesthesia Preprocedure Evaluation (Addendum)
Anesthesia Evaluation  Patient identified by MRN, date of birth, ID band Patient awake    Reviewed: Allergy & Precautions, H&P , NPO status , Patient's Chart, lab work & pertinent test results, reviewed documented beta blocker date and time   History of Anesthesia Complications (+) PROLONGED EMERGENCE  Airway       Dental   Pulmonary neg pulmonary ROS,          Cardiovascular hypertension, Pt. on medications and Pt. on home beta blockers + CAD, + Past MI and + Cardiac Stents negative cardio ROS      Neuro/Psych negative neurological ROS  negative psych ROS   GI/Hepatic negative GI ROS, Neg liver ROS,   Endo/Other  negative endocrine ROS  Renal/GU negative Renal ROS     Musculoskeletal negative musculoskeletal ROS (+)   Abdominal   Peds  Hematology negative hematology ROS (+)   Anesthesia Other Findings   Reproductive/Obstetrics negative OB ROS                        Anesthesia Physical Anesthesia Plan  ASA: III  Anesthesia Plan: General   Post-op Pain Management:    Induction: Intravenous  Airway Management Planned: Oral ETT  Additional Equipment:   Intra-op Plan:   Post-operative Plan: Extubation in OR  Informed Consent: I have reviewed the patients History and Physical, chart, labs and discussed the procedure including the risks, benefits and alternatives for the proposed anesthesia with the patient or authorized representative who has indicated his/her understanding and acceptance.   Dental advisory given  Plan Discussed with:   Anesthesia Plan Comments:         Anesthesia Quick Evaluation

## 2013-10-28 ENCOUNTER — Encounter (HOSPITAL_COMMUNITY): Payer: Self-pay | Admitting: General Surgery

## 2013-10-28 LAB — BASIC METABOLIC PANEL
Anion gap: 11 (ref 5–15)
BUN: 17 mg/dL (ref 6–23)
CO2: 27 mEq/L (ref 19–32)
Calcium: 9 mg/dL (ref 8.4–10.5)
Chloride: 101 mEq/L (ref 96–112)
Creatinine, Ser: 0.59 mg/dL (ref 0.50–1.10)
Glucose, Bld: 152 mg/dL — ABNORMAL HIGH (ref 70–99)
Potassium: 4.5 mEq/L (ref 3.7–5.3)
Sodium: 139 mEq/L (ref 137–147)

## 2013-10-28 LAB — CBC
HCT: 32.2 % — ABNORMAL LOW (ref 36.0–46.0)
Hemoglobin: 10.6 g/dL — ABNORMAL LOW (ref 12.0–15.0)
MCH: 29.5 pg (ref 26.0–34.0)
MCHC: 32.9 g/dL (ref 30.0–36.0)
MCV: 89.7 fL (ref 78.0–100.0)
PLATELETS: 371 10*3/uL (ref 150–400)
RBC: 3.59 MIL/uL — AB (ref 3.87–5.11)
RDW: 13.4 % (ref 11.5–15.5)
WBC: 16 10*3/uL — ABNORMAL HIGH (ref 4.0–10.5)

## 2013-10-28 MED ORDER — ALVIMOPAN 12 MG PO CAPS
12.0000 mg | ORAL_CAPSULE | Freq: Two times a day (BID) | ORAL | Status: DC
  Administered 2013-10-28 – 2013-10-30 (×5): 12 mg via ORAL
  Filled 2013-10-28 (×8): qty 1

## 2013-10-28 NOTE — Evaluation (Signed)
Physical Therapy Evaluation Patient Details Name: Tirza Senteno MRN: [id number] DOB: 1950/02/16 Today's Date: 10/28/2013   History of Present Illness  ROBOT ASSISTED LOW ANTERIOR RESECTION rigid prostoscopy on 10/27/13  Clinical Impression  Patient  Stated  Not being ready to walk  But did sit and stand up at the bedside. Pt will benefit from PT to address the problems listed in note below.    Follow Up Recommendations Home health PT (may not need.)    Equipment Recommendations  None recommended by PT    Recommendations for Other Services       Precautions / Restrictions Precautions Precaution Comments: drain R side       Mobility  Bed Mobility Overal bed mobility: Needs Assistance Bed Mobility: Supine to Sit;Sit to Sidelying     Supine to sit: Mod assist   Sit to sidelying: Mod assist General bed mobility comments: support of trunk into upright, encouraged  rooling to side, then sitting/, cues to go to side then legs up onto bed.  Transfers Overall transfer level: Needs assistance Equipment used: 1 person hand held assist Transfers: Sit to/from Stand Sit to Stand: Mod assist         General transfer comment: shelf arm, trunk hug by daughter to stand and take 5 side steps along bed.  Ambulation/Gait                Stairs            Wheelchair Mobility    Modified Rankin (Stroke Patients Only)       Balance                                             Pertinent Vitals/Pain Pain Assessment: 0-10 Pain Score: 3  Pain Location: abdomen, lower Pain Intervention(s): PCA encouraged;Repositioned;Relaxation    Home Living Family/patient expects to be discharged to:: Private residence   Available Help at Discharge: Family Type of Home: House Home Access: Stairs to enter Entrance Stairs-Rails: None Technical brewer of Steps: 2 Home Layout: One level Home Equipment: Environmental consultant - 2 wheels Additional Comments: spouse works at  night, daughter will be in a few days     Prior Function Level of Independence: Independent               Hand Dominance        Extremity/Trunk Assessment   Upper Extremity Assessment: Generalized weakness           Lower Extremity Assessment: Generalized weakness         Communication   Communication: No difficulties  Cognition Arousal/Alertness: Awake/alert Behavior During Therapy: WFL for tasks assessed/performed Overall Cognitive Status: Within Functional Limits for tasks assessed                      General Comments      Exercises        Assessment/Plan    PT Assessment Patient needs continued PT services  PT Diagnosis Difficulty walking;Generalized weakness;Acute pain   PT Problem List Decreased activity tolerance;Decreased mobility;Decreased knowledge of use of DME;Decreased safety awareness;Decreased knowledge of precautions;Pain  PT Treatment Interventions DME instruction;Gait training;Stair training;Functional mobility training;Therapeutic activities;Therapeutic exercise;Patient/family education   PT Goals (Current goals can be found in the Care Plan section) Acute Rehab PT Goals Patient Stated Goal: to take care of my farm animals. PT Goal Formulation:  With patient/family Time For Goal Achievement: 11/11/13 Potential to Achieve Goals: Good    Frequency Min 3X/week   Barriers to discharge        Co-evaluation               End of Session   Activity Tolerance: Patient tolerated treatment well Patient left: in bed;with call bell/phone within reach;with family/visitor present           Time: 9702-6378 PT Time Calculation (min): 12 min   Charges:   PT Evaluation $Initial PT Evaluation Tier I: 1 Procedure PT Treatments $Gait Training: 8-22 mins   PT G Codes:          Claretha Cooper 10/28/2013, 3:14 PM Tresa Endo PT [phone]

## 2013-10-28 NOTE — Anesthesia Postprocedure Evaluation (Signed)
Anesthesia Post Note  Patient: Ashley Conley  Procedure(s) Performed: Procedure(s) (LRB): XI ROBOT ASSISTED LAPAROSCOPIC LOW ANTERIOR RESECTION (N/A) RIGID PROCTOSCOPY (N/A)  Anesthesia type: General  Patient location: PACU  Post pain: Pain level controlled  Post assessment: Post-op Vital signs reviewed  Last Vitals: BP 108/68  Pulse 67  Temp(Src) 36.7 C (Oral)  Resp 19  Ht 5\' 2"  (1.575 m)  Wt 139 lb (63.05 kg)  BMI 25.42 kg/m2  SpO2 95%  Post vital signs: Reviewed  Level of consciousness: sedated  Complications: No apparent anesthesia complications

## 2013-10-28 NOTE — Progress Notes (Signed)
1 Day Post-Op Robotic assisted LAR Subjective: Doing well, pain controlled, min nausea  Objective: Vital signs in last 24 hours: Temp:  [97.3 F (36.3 C)-97.9 F (36.6 C)] 97.9 F (36.6 C) (09/15 0534) Pulse Rate:  [66-113] 66 (09/15 0534) Resp:  [11-21] 11 (09/15 0744) BP: (121-141)/(60-88) 121/60 mmHg (09/15 0534) SpO2:  [92 %-100 %] 93 % (09/15 0744) Weight:  [139 lb (63.05 kg)] 139 lb (63.05 kg) (09/14 0830)   Intake/Output from previous day: 09/14 0701 - 09/15 0700 In: 3368.5 [I.V.:3368.5] Out: 835 [Urine:450; Drains:335; Blood:50] Intake/Output this shift:     General appearance: alert and cooperative GI: normal findings: soft, appropriately tender, non-distended  Incision: healing well, no significant drainage, no significant erythema  Lab Results:   Recent Labs  10/28/13 0455  WBC 16.0*  HGB 10.6*  HCT 32.2*  PLT 371   BMET  Recent Labs  10/28/13 0455  NA 139  K 4.5  CL 101  CO2 27  GLUCOSE 152*  BUN 17  CREATININE 0.59  CALCIUM 9.0   PT/INR No results found for this basename: LABPROT, INR,  in the last 72 hours ABG No results found for this basename: PHART, PCO2, PO2, HCO3,  in the last 72 hours  MEDS, Scheduled . acetaminophen  1,000 mg Oral 4 times per day  . alvimopan  12 mg Oral BID  . antiseptic oral rinse  7 mL Mouth Rinse q12n4p  . chlorhexidine  15 mL Mouth Rinse BID  . enoxaparin (LOVENOX) injection  40 mg Subcutaneous Q24H  . ezetimibe  10 mg Oral q morning - 10a  . metoprolol tartrate  25 mg Oral BID  . morphine   Intravenous 6 times per day    Studies/Results: No results found.  Assessment: s/p Procedure(s): XI ROBOT ASSISTED LAPAROSCOPIC LOW ANTERIOR RESECTION RIGID PROCTOSCOPY Patient Active Problem List   Diagnosis Date Noted  . 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 CAD (coronary artery disease), native coronary artery 09/17/2013  . Breast cancer   . Hypertension   . Hyperlipidemia   . NSTEMI (non-ST elevated  myocardial infarction) 01/12/2013    Expected post op course  Plan: Advance diet to clears Ambulate as tolerated Incentive spirometry   LOS: 1 day     .Ashley Conley, [facility], [place]   10/28/2013 8:26 AM

## 2013-10-29 LAB — CBC
HCT: 29.6 % — ABNORMAL LOW (ref 36.0–46.0)
Hemoglobin: 9.4 g/dL — ABNORMAL LOW (ref 12.0–15.0)
MCH: 29.4 pg (ref 26.0–34.0)
MCHC: 31.8 g/dL (ref 30.0–36.0)
MCV: 92.5 fL (ref 78.0–100.0)
PLATELETS: 299 10*3/uL (ref 150–400)
RBC: 3.2 MIL/uL — ABNORMAL LOW (ref 3.87–5.11)
RDW: 13.5 % (ref 11.5–15.5)
WBC: 11.9 10*3/uL — AB (ref 4.0–10.5)

## 2013-10-29 LAB — BASIC METABOLIC PANEL
Anion gap: 9 (ref 5–15)
BUN: 13 mg/dL (ref 6–23)
CO2: 30 meq/L (ref 19–32)
Calcium: 8.8 mg/dL (ref 8.4–10.5)
Chloride: 101 mEq/L (ref 96–112)
Creatinine, Ser: 0.55 mg/dL (ref 0.50–1.10)
GFR calc non Af Amer: >90 mL/min (ref >90)
Glucose, Bld: 101 mg/dL — ABNORMAL HIGH (ref 70–99)
Potassium: 3.7 mEq/L (ref 3.7–5.3)
SODIUM: 140 meq/L (ref 137–147)

## 2013-10-29 LAB — GLUCOSE, CAPILLARY
GLUCOSE-CAPILLARY: 119 mg/dL — AB (ref 70–99)
GLUCOSE-CAPILLARY: 108 mg/dL — AB (ref 70–99)
GLUCOSE-CAPILLARY: 97 mg/dL (ref 70–99)

## 2013-10-29 MED ORDER — NALOXONE HCL 0.4 MG/ML IJ SOLN: 0.4000 mg | INTRAMUSCULAR | Status: DC | PRN

## 2013-10-29 MED ORDER — SODIUM CHLORIDE 0.9 % IJ SOLN: 9.0000 mL | INTRAMUSCULAR | Status: DC | PRN

## 2013-10-29 MED ORDER — MORPHINE SULFATE (PF) 1 MG/ML IV SOLN: INTRAVENOUS | Status: DC

## 2013-10-29 MED ORDER — MORPHINE SULFATE (PF) 1 MG/ML IV SOLN
INTRAVENOUS | Status: DC
  Administered 2013-10-29: 3 mg via INTRAVENOUS
  Administered 2013-10-29: 4.5 mg via INTRAVENOUS
  Administered 2013-10-29: 14:00:00 via INTRAVENOUS
  Administered 2013-10-30: 1 mg via INTRAVENOUS
  Filled 2013-10-29: qty 25

## 2013-10-29 MED ORDER — ONDANSETRON HCL 4 MG/2ML IJ SOLN
4.0000 mg | Freq: Four times a day (QID) | INTRAMUSCULAR | Status: DC | PRN
  Administered 2013-10-29: 4 mg via INTRAVENOUS
  Filled 2013-10-29: qty 2

## 2013-10-29 MED ORDER — DIPHENHYDRAMINE HCL 50 MG/ML IJ SOLN: 12.5000 mg | Freq: Four times a day (QID) | INTRAMUSCULAR | Status: DC | PRN

## 2013-10-29 MED ORDER — DIPHENHYDRAMINE HCL 12.5 MG/5ML PO ELIX: 12.5000 mg | ORAL_SOLUTION | Freq: Four times a day (QID) | ORAL | Status: DC | PRN

## 2013-10-29 MED ORDER — ONDANSETRON HCL 4 MG/2ML IJ SOLN: 4.0000 mg | Freq: Four times a day (QID) | INTRAMUSCULAR | Status: DC | PRN

## 2013-10-29 MED ORDER — KCL IN DEXTROSE-NACL 20-5-0.45 MEQ/L-%-% IV SOLN
INTRAVENOUS | Status: DC
Start: 2013-10-29 — End: 2013-10-30
  Administered 2013-10-29: 09:00:00 via INTRAVENOUS
  Administered 2013-10-30: 50 mL/h via INTRAVENOUS
  Filled 2013-10-29 (×2): qty 1000

## 2013-10-29 MED ORDER — INSULIN ASPART 100 UNIT/ML ~~LOC~~ SOLN: 0.0000 [IU] | Freq: Three times a day (TID) | SUBCUTANEOUS | Status: DC

## 2013-10-29 NOTE — Progress Notes (Addendum)
2 Days Post-Op Robotic assisted LAR Subjective: Doing well, pain controlled, some nausea yesterday, passing some blood per rectum, feels that PCA dose is too high  Objective: Vital signs in last 24 hours: Temp:  [97.5 F (36.4 C)-98.3 F (36.8 C)] 97.5 F (36.4 C) (09/16 0543) Pulse Rate:  [67-83] 83 (09/16 0543) Resp:  [16-22] 17 (09/16 0543) BP: (108-122)/(60-72) 122/65 mmHg (09/16 0543) SpO2:  [90 %-98 %] 91 % (09/16 0543)   Intake/Output from previous day: 09/15 0701 - 09/16 0700 In: 2520 [P.O.:120; I.V.:2400] Out: 1230 [Urine:1125; Drains:105] Intake/Output this shift: Total I/O In: -  Out: 200 [Urine:200]   General appearance: alert and cooperative GI: normal findings: soft, appropriately tender, non-distended  Incision: healing well, no significant drainage, no significant erythema  Lab Results:   Recent Labs  10/28/13 0455 10/29/13 0518  WBC 16.0* 11.9*  HGB 10.6* 9.4*  HCT 32.2* 29.6*  PLT 371 299   BMET  Recent Labs  10/28/13 0455 10/29/13 0518  NA 139 140  K 4.5 3.7  CL 101 101  CO2 27 30  GLUCOSE 152* 101*  BUN 17 13  CREATININE 0.59 0.55  CALCIUM 9.0 8.8   PT/INR No results found for this basename: LABPROT, INR,  in the last 72 hours ABG No results found for this basename: PHART, PCO2, PO2, HCO3,  in the last 72 hours  MEDS, Scheduled . alvimopan  12 mg Oral BID  . antiseptic oral rinse  7 mL Mouth Rinse q12n4p  . chlorhexidine  15 mL Mouth Rinse BID  . enoxaparin (LOVENOX) injection  40 mg Subcutaneous Q24H  . ezetimibe  10 mg Oral q morning - 10a  . metoprolol tartrate  25 mg Oral BID  . morphine   Intravenous 6 times per day    Studies/Results: No results found.  Assessment: s/p Procedure(s): XI ROBOT ASSISTED LAPAROSCOPIC LOW ANTERIOR RESECTION RIGID PROCTOSCOPY Patient Active Problem List   Diagnosis Date Noted  . 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 CAD (coronary artery disease), native coronary artery 09/17/2013  .  Breast cancer   . Hypertension   . Hyperlipidemia   . NSTEMI (non-ST elevated myocardial infarction) 01/12/2013    Expected post op course  Plan: Cont clears Ambulate as tolerated Incentive spirometry Reduced dose PCA   LOS: 2 days     .Rosario Adie, [facility], [place]   10/29/2013 8:26 AM

## 2013-10-29 NOTE — Progress Notes (Signed)
PT Cancellation Note  Patient Details Name: Ashley Conley MRN: [id number] DOB: 03-Aug-1950   Cancelled Treatment:    Reason Eval/Treat Not Completed: Patient declined, no reason specified (pt reports feeling "too drugged" to mobilize at this time)   Shameria Trimarco,KATHrine E 10/29/2013, 11:13 AM Carmelia Bake, PT, DPT 10/29/2013 Pager: [phone]

## 2013-10-30 LAB — CBC
HEMATOCRIT: 27.2 % — AB (ref 36.0–46.0)
Hemoglobin: 8.7 g/dL — ABNORMAL LOW (ref 12.0–15.0)
MCH: 29.4 pg (ref 26.0–34.0)
MCHC: 32 g/dL (ref 30.0–36.0)
MCV: 91.9 fL (ref 78.0–100.0)
PLATELETS: 273 10*3/uL (ref 150–400)
RBC: 2.96 MIL/uL — ABNORMAL LOW (ref 3.87–5.11)
RDW: 13.3 % (ref 11.5–15.5)
WBC: 8 10*3/uL (ref 4.0–10.5)

## 2013-10-30 LAB — BASIC METABOLIC PANEL
ANION GAP: 8 (ref 5–15)
BUN: 10 mg/dL (ref 6–23)
CALCIUM: 8.4 mg/dL (ref 8.4–10.5)
CO2: 30 mEq/L (ref 19–32)
Chloride: 103 mEq/L (ref 96–112)
Creatinine, Ser: 0.63 mg/dL (ref 0.50–1.10)
GFR calc non Af Amer: >90 mL/min (ref >90)
Glucose, Bld: 117 mg/dL — ABNORMAL HIGH (ref 70–99)
Potassium: 4.4 mEq/L (ref 3.7–5.3)
Sodium: 141 mEq/L (ref 137–147)

## 2013-10-30 LAB — GLUCOSE, CAPILLARY
GLUCOSE-CAPILLARY: 107 mg/dL — AB (ref 70–99)
GLUCOSE-CAPILLARY: 79 mg/dL (ref 70–99)
Glucose-Capillary: 101 mg/dL — ABNORMAL HIGH (ref 70–99)
Glucose-Capillary: 90 mg/dL (ref 70–99)

## 2013-10-30 MED ORDER — TRAMADOL HCL 50 MG PO TABS: 50.0000 mg | ORAL_TABLET | Freq: Four times a day (QID) | ORAL | Status: DC | PRN

## 2013-10-30 MED ORDER — TICAGRELOR 90 MG PO TABS
90.0000 mg | ORAL_TABLET | Freq: Two times a day (BID) | ORAL | Status: DC
  Administered 2013-10-30 (×2): 90 mg via ORAL
  Filled 2013-10-30 (×4): qty 1

## 2013-10-30 MED ORDER — HYDROCODONE-ACETAMINOPHEN 5-325 MG PO TABS
1.0000 | ORAL_TABLET | ORAL | Status: DC | PRN
  Administered 2013-10-30: 1 via ORAL
  Administered 2013-10-30 – 2013-10-31 (×3): 2 via ORAL
  Filled 2013-10-30 (×4): qty 2

## 2013-10-30 NOTE — Discharge Instructions (Signed)

## 2013-10-30 NOTE — Progress Notes (Signed)
3 Days Post-Op Robotic assisted LAR Subjective: Doing well, pain controlled, tolerating clears, passing some flatus  Objective: Vital signs in last 24 hours: Temp:  [97.6 F (36.4 C)-99 F (37.2 C)] 97.8 F (36.6 C) (09/17 0600) Pulse Rate:  [63-73] 63 (09/17 0600) Resp:  [15-21] 16 (09/17 0600) BP: (121-138)/(59-82) 131/67 mmHg (09/17 0600) SpO2:  [91 %-98 %] 93 % (09/17 0600)   Intake/Output from previous day: 09/16 0701 - 09/17 0700 In: 590.8 [P.O.:360; I.V.:230.8] Out: 2515 [Urine:2400; Drains:115] Intake/Output this shift:     General appearance: alert and cooperative GI: normal findings: soft, appropriately tender, non-distended  Incision: healing well, no significant drainage, no significant erythema  Lab Results:   Recent Labs  10/29/13 0518 10/30/13 0509  WBC 11.9* 8.0  HGB 9.4* 8.7*  HCT 29.6* 27.2*  PLT 299 273   BMET  Recent Labs  10/29/13 0518 10/30/13 0509  NA 140 141  K 3.7 4.4  CL 101 103  CO2 30 30  GLUCOSE 101* 117*  BUN 13 10  CREATININE 0.55 0.63  CALCIUM 8.8 8.4   PT/INR No results found for this basename: LABPROT, INR,  in the last 72 hours ABG No results found for this basename: PHART, PCO2, PO2, HCO3,  in the last 72 hours  MEDS, Scheduled . alvimopan  12 mg Oral BID  . enoxaparin (LOVENOX) injection  40 mg Subcutaneous Q24H  . ezetimibe  10 mg Oral q morning - 10a  . metoprolol tartrate  25 mg Oral BID    Studies/Results: No results found.  Assessment: s/p Procedure(s): XI ROBOT ASSISTED LAPAROSCOPIC LOW ANTERIOR RESECTION RIGID PROCTOSCOPY Patient Active Problem List   Diagnosis Date Noted  . 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 CAD (coronary artery disease), native coronary artery 09/17/2013  . Breast cancer   . Hypertension   . Hyperlipidemia   . NSTEMI (non-ST elevated myocardial infarction) 01/12/2013    Expected post op course  Plan: Advance to soft diet D/c pca SL IV PO pain meds Poss d/c tom   LOS: 3 days     .Rosario Adie, [facility], [place]   10/30/2013 8:33 AM

## 2013-10-30 NOTE — Progress Notes (Signed)
Physical Therapy Treatment Patient Details Name: Emilija Bohman MRN: [id number] DOB: April 08, 1950 Today's Date: 10/30/2013    History of Present Illness ROBOT ASSISTED LOW ANTERIOR RESECTION rigid prostoscopy on 10/27/13    PT Comments    Pt feeling 100% better today.  Was able to get self OOB and tolerated amb full unit twice around hand held assist.  Good safety cognition and good steady gait.  Pt stated yesterday was NOT a good day when the therapist came.  Follow Up Recommendations  Home health PT (progressing well, may NOT need HH PT after all.  Will consult LPT.)     Equipment Recommendations  None recommended by PT    Recommendations for Other Services       Precautions / Restrictions Precautions Precaution Comments: drain R side  Restrictions Weight Bearing Restrictions: No    Mobility  Bed Mobility Overal bed mobility: Modified Independent             General bed mobility comments: Mod Independent  Transfers Overall transfer level: Modified independent Equipment used: 1 person hand held assist Transfers: Sit to/from Stand Sit to Stand: Modified independent (Device/Increase time)         General transfer comment: good safety cognition and use of hands to steady self/assist self  Ambulation/Gait Ambulation/Gait assistance: Supervision;Min guard Ambulation Distance (Feet): 550 Feet Assistive device: None Gait Pattern/deviations: Step-through pattern Gait velocity: WFL   General Gait Details: + 1 hand held assist with no LOB and good safety cognition.  Pt stated yesterday was not a good day when the therapist came.  Feeleing 100% better today.   Stairs            Wheelchair Mobility    Modified Rankin (Stroke Patients Only)       Balance                                    Cognition                            Exercises      General Comments        Pertinent Vitals/Pain      Home Living                      Prior Function            PT Goals (current goals can now be found in the care plan section) Progress towards PT goals: Progressing toward goals    Frequency  Min 3X/week    PT Plan      Co-evaluation             End of Session Equipment Utilized During Treatment: Gait belt Activity Tolerance: Patient tolerated treatment well Patient left: in bed;with call bell/phone within reach;with family/visitor present     Time: 1351-1405 PT Time Calculation (min): 14 min  Charges:  $Gait Training: 8-22 mins                    G Codes:      Rica Koyanagi  PTA [facility]  Acute  Rehab Pager      [phone]

## 2013-10-31 LAB — GLUCOSE, CAPILLARY: Glucose-Capillary: 76 mg/dL (ref 70–99)

## 2013-10-31 LAB — HEMOGLOBIN: Hemoglobin: 9.8 g/dL — ABNORMAL LOW (ref 12.0–15.0)

## 2013-10-31 MED ORDER — TRAMADOL HCL 50 MG PO TABS: 50.0000 mg | ORAL_TABLET | Freq: Four times a day (QID) | ORAL | Status: DC | PRN

## 2013-10-31 NOTE — Discharge Summary (Addendum)
Physician Discharge Summary  Patient ID: Ashley Conley MRN: [id number] DOB/AGE: 1950-08-16 63 y.o.  Admit date: 10/27/2013 Discharge date: 10/31/2013  Admission Diagnoses:  Discharge Diagnoses:  Active Problems:   * No active hospital problems. *   Discharged Condition: good  Hospital Course: The patient was admitted to the floor after robotic assisted low anterior resection.  She did well.  Her diet was advanced slowly.  Her foley was removed on POD 2.  She was having bowel function by the end of POD 3 and by POD 4 she was tolerating a soft diet and pain was controlled with PO pain meds.    Consults: None  Significant Diagnostic Studies: labs: cbc, chemistry  Treatments: IV hydration, analgesia: acetaminophen w/ codeine and surgery: see above  Discharge Exam: Blood pressure 128/63, pulse 62, temperature 97.7 F (36.5 C), temperature source Oral, resp. rate 16, height 5\' 2"  (1.575 m), weight 139 lb (63.05 kg), SpO2 97.00%. General appearance: alert and cooperative GI: normal findings: soft, non-tender Incision/Wound: clean, dry, some bruising noted  Disposition: 01-Home or Self Care     Medication List         aspirin 81 MG chewable tablet  Chew 81 mg by mouth daily.     BRILINTA 90 MG Tabs tablet  Generic drug:  ticagrelor  Take 90 mg by mouth 2 (two) times daily.     CoQ-10 200 MG Caps  Take 200 mg by mouth daily.     ezetimibe 10 MG tablet  Commonly known as:  ZETIA  Take 10 mg by mouth every morning.     metoprolol tartrate 25 MG tablet  Commonly known as:  LOPRESSOR  Take 25 mg by mouth 2 (two) times daily.     nitroGLYCERIN 0.4 MG SL tablet  Commonly known as:  NITROSTAT  Place 1 tablet (0.4 mg total) under the tongue every 5 (five) minutes as needed for chest pain.     RA KRILL OIL 500 MG Caps  Take 500 mg by mouth daily.     traMADol 50 MG tablet  Commonly known as:  ULTRAM  Take 1-2 tablets (50-100 mg total) by mouth every 6 (six) hours as needed  for moderate pain.           Follow-up Information   Follow up with Rosario Adie., MD In 2 weeks.   Specialty:  General Surgery   Contact information:   [address] [place] [address] [phone]       Signed: Rosario Adie 06/15/8880, 8:00 AM

## 2013-11-07 ENCOUNTER — Ambulatory Visit (INDEPENDENT_AMBULATORY_CARE_PROVIDER_SITE_OTHER): Payer: 59 | Admitting: Cardiovascular Disease

## 2013-11-07 ENCOUNTER — Encounter: Payer: Self-pay | Admitting: Cardiovascular Disease

## 2013-11-07 VITALS — BP 114/72 | HR 63 | Ht 62.0 in | Wt 132.0 lb

## 2013-11-07 DIAGNOSIS — I2510 Atherosclerotic heart disease of native coronary artery without angina pectoris: Secondary | ICD-10-CM

## 2013-11-07 DIAGNOSIS — I10 Essential (primary) hypertension: Secondary | ICD-10-CM

## 2013-11-07 DIAGNOSIS — E785 Hyperlipidemia, unspecified: Secondary | ICD-10-CM

## 2013-11-07 NOTE — Assessment & Plan Note (Signed)
Stable with no angina and good activity level.  Continue medical Rx Stop Brillinta 01/13/14

## 2013-11-07 NOTE — Assessment & Plan Note (Signed)
Well controlled.  Continue current medications and low sodium Dash type diet.    

## 2013-11-07 NOTE — Assessment & Plan Note (Signed)
Cholesterol is at goal.  Continue current dose of statin and diet Rx.  No myalgias or side effects.  F/U  LFT's in 6 months. No results found for this basename: [place] post bowel surgery once on regular diet check labs

## 2013-11-07 NOTE — Patient Instructions (Signed)
Your physician wants you to follow-up in:    [place] will receive a reminder letter in the mail two months in advance. If you don't receive a letter, please call our office to schedule the follow-up appointment. Your physician has recommended you make the following change in your medication:  [place] 1 ST

## 2013-11-07 NOTE — Progress Notes (Signed)
Patient ID: Ashley Conley, female   DOB: [birth year], 63 y.o.   MRN: [id number] 63 yo known CAD Had SEMI in 01/12/13 Cath with Arida with stent to circumflex  Coronary angiography:  Coronary dominance: Right  Left Anterior Descending (LAD): The LAD is normal in size but of small-caliber in general. There are minor irregularities proximally. In the mid to distal segment there is an 80% stenosis which is after the second diagonal.  Circumflex (LCx): Normal in size and nondominant. There is a 99% hazy stenosis in the midsegment at the origin of OM 2 which is likely the culprit for presentation. .  2nd obtuse marginal: Small in size with 90% ostial stenosis.  Right Coronary Artery: Normal in size and dominant. There is a 40-50% tubular stenosis in the midsegment. There is 40-50% tubular stenosis in the distal segment.   Left ventriculography: Left ventricular systolic function is normal , LVEF is estimated at 55 %, there is no significant mitral regurgitation  F/U myovue with no ischemia and normal EF 63% done 02/25/13  Diverticulitis with recent bowel resection by Dr Grandville Silos Still on soft solids and ensure  Brillinta stopped with no complications   ROS: Denies fever, malais, weight loss, blurry vision, decreased visual acuity, cough, sputum, SOB, hemoptysis, pleuritic pain, palpitaitons, heartburn, abdominal pain, melena, lower extremity edema, claudication, or rash.  All other systems reviewed and negative  General: Affect appropriate Healthy:  appears stated age 6: normal Neck supple with no adenopathy JVP normal no bruits no thyromegaly Lungs clear with no wheezing and good diaphragmatic motion Heart:  S1/S2 no murmur, no rub, gallop or click PMI normal Abdomen: benighn, BS positve, no tenderness, no AAA recent bowel surgery wounds healing well  no bruit.  No HSM or HJR Distal pulses intact with no bruits No edema Neuro non-focal Skin warm and dry No muscular weakness   Current  Outpatient Prescriptions  Medication Sig Dispense Refill  . aspirin 81 MG chewable tablet Chew 81 mg by mouth daily.       . Coenzyme Q10 (COQ-10) 200 MG CAPS Take 200 mg by mouth daily.      Marland Kitchen ezetimibe (ZETIA) 10 MG tablet Take 10 mg by mouth every morning.      . metoprolol tartrate (LOPRESSOR) 25 MG tablet Take 25 mg by mouth 2 (two) times daily.      . nitroGLYCERIN (NITROSTAT) 0.4 MG SL tablet Place 1 tablet (0.4 mg total) under the tongue every 5 (five) minutes as needed for chest pain.  25 tablet  3  . RA KRILL OIL 500 MG CAPS Take 500 mg by mouth daily.      . ticagrelor (BRILINTA) 90 MG TABS tablet Take 90 mg by mouth 2 (two) times daily.      . traMADol (ULTRAM) 50 MG tablet Take 1-2 tablets (50-100 mg total) by mouth every 6 (six) hours as needed for moderate pain.  30 tablet  0   No current facility-administered medications for this visit.    Allergies  Statins; Sulfa antibiotics; and Advil  Electrocardiogram:  SR rate 96 inferolateral J point elevation   Assessment and Plan

## 2013-12-18 ENCOUNTER — Other Ambulatory Visit: Payer: Self-pay | Admitting: Physician Assistant

## 2013-12-24 ENCOUNTER — Other Ambulatory Visit: Payer: Self-pay | Admitting: Physician Assistant

## 2014-01-22 ENCOUNTER — Encounter (HOSPITAL_COMMUNITY): Payer: Self-pay | Admitting: Cardiovascular Disease

## 2014-05-05 NOTE — Progress Notes (Signed)
Patient ID: Ashley Conley, female   DOB: [birth year], 64 y.o.   MRN: [id number] 64 y.o.  known CAD Had SEMI in 01/12/13 Cath with Arida with stent to circumflex  Coronary angiography:  Coronary dominance: Right  Left Anterior Descending (LAD): The LAD is normal in size but of small-caliber in general. There are minor irregularities proximally. In the mid to distal segment there is an 80% stenosis which is after the second diagonal.  Circumflex (LCx): Normal in size and nondominant. There is a 99% hazy stenosis in the midsegment at the origin of OM 2 which is likely the culprit for presentation. .  2nd obtuse marginal: Small in size with 90% ostial stenosis.  Right Coronary Artery: Normal in size and dominant. There is a 40-50% tubular stenosis in the midsegment. There is 40-50% tubular stenosis in the distal segment.   Left ventriculography: Left ventricular systolic function is normal , LVEF is estimated at 55 %, there is no significant mitral regurgitation  F/U myovue with no ischemia and normal EF 63% done 02/25/13  Diverticulitis with recent bowel resection by Dr Grandville Silos 2015    Brillinta stopped with no complications   ROS: Denies fever, malais, weight loss, blurry vision, decreased visual acuity, cough, sputum, SOB, hemoptysis, pleuritic pain, palpitaitons, heartburn, abdominal pain, melena, lower extremity edema, claudication, or rash.  All other systems reviewed and negative  General: Affect appropriate Healthy:  appears stated age 89: normal Neck supple with no adenopathy JVP normal no bruits no thyromegaly Lungs clear with no wheezing and good diaphragmatic motion Heart:  S1/S2 no murmur, no rub, gallop or click PMI normal Abdomen: benighn, BS positve, no tenderness, no AAA recent bowel surgery wounds healing well  no bruit.  No HSM or HJR Distal pulses intact with no bruits No edema Neuro non-focal Skin warm and dry No muscular weakness   Current Outpatient Prescriptions   Medication Sig Dispense Refill  . aspirin 81 MG chewable tablet Chew 81 mg by mouth daily.     . Coenzyme Q10 (COQ-10) 200 MG CAPS Take 200 mg by mouth daily.    Marland Kitchen ezetimibe (ZETIA) 10 MG tablet Take 10 mg by mouth every morning.    . metoprolol tartrate (LOPRESSOR) 25 MG tablet Take 25 mg by mouth 2 (two) times daily.    . metoprolol tartrate (LOPRESSOR) 25 MG tablet TAKE 1 TABLET (25 MG TOTAL) BY MOUTH 2 (TWO) TIMES DAILY. 60 tablet 6  . RA KRILL OIL 500 MG CAPS Take 500 mg by mouth daily.    Marland Kitchen ZETIA 10 MG tablet TAKE 1 TABLET (10 MG TOTAL) BY MOUTH DAILY. 30 tablet 11  . nitroGLYCERIN (NITROSTAT) 0.4 MG SL tablet Place 1 tablet (0.4 mg total) under the tongue every 5 (five) minutes as needed for chest pain. (Patient not taking: Reported on 05/06/2014) 25 tablet 3  . traMADol (ULTRAM) 50 MG tablet Take 1-2 tablets (50-100 mg total) by mouth every 6 (six) hours as needed for moderate pain. (Patient not taking: Reported on 05/06/2014) 30 tablet 0   No current facility-administered medications for this visit.    Allergies  Statins; Sulfa antibiotics; and Advil  Electrocardiogram:  9/15  SR rate 96 inferolateral J point elevation   Assessment and Plan

## 2014-05-06 ENCOUNTER — Ambulatory Visit (INDEPENDENT_AMBULATORY_CARE_PROVIDER_SITE_OTHER): Payer: Self-pay | Admitting: Cardiovascular Disease

## 2014-05-06 ENCOUNTER — Encounter: Payer: Self-pay | Admitting: Cardiovascular Disease

## 2014-05-06 VITALS — BP 128/67 | HR 60 | Ht 62.0 in | Wt 147.0 lb

## 2014-05-06 DIAGNOSIS — I2510 Atherosclerotic heart disease of native coronary artery without angina pectoris: Secondary | ICD-10-CM

## 2014-05-06 DIAGNOSIS — E785 Hyperlipidemia, unspecified: Secondary | ICD-10-CM

## 2014-05-06 DIAGNOSIS — I10 Essential (primary) hypertension: Secondary | ICD-10-CM

## 2014-05-06 NOTE — Assessment & Plan Note (Signed)
Well controlled.  Continue current medications and low sodium Dash type diet.    

## 2014-05-06 NOTE — Patient Instructions (Signed)
Your physician wants you to follow-up in:  6 MONTHS WITH DR NISHAN  You will receive a reminder letter in the mail two months in advance. If you don't receive a letter, please call our office to schedule the follow-up appointment. Your physician recommends that you continue on your current medications as directed. Please refer to the Current Medication list given to you today. 

## 2014-05-06 NOTE — Assessment & Plan Note (Signed)
Stable with no angina and good activity level.  Continue medical Rx Continue ASA and beta blocker circumflex stent 2014 off [place] now

## 2014-05-06 NOTE — Assessment & Plan Note (Signed)
F/U labs statins cause increased LFTls  Continue zetia

## 2014-07-15 ENCOUNTER — Other Ambulatory Visit: Payer: Self-pay | Admitting: Cardiovascular Disease

## 2014-12-20 NOTE — Progress Notes (Signed)
Patient ID: Ashley Conley, female   DOB: 01-03-1951, 64 y.o.   MRN: [id number] 64 y.o.  known CAD Had SEMI in 01/12/13 Cath with Arida with stent to circumflex  Coronary angiography:  Coronary dominance: Right  Left Anterior Descending (LAD): The LAD is normal in size but of small-caliber in general. There are minor irregularities proximally. In the mid to distal segment there is an 80% stenosis which is after the second diagonal.  Circumflex (LCx): Normal in size and nondominant. There is a 99% hazy stenosis in the midsegment at the origin of OM 2 which is likely the culprit for presentation. .  2nd obtuse marginal: Small in size with 90% ostial stenosis.  Right Coronary Artery: Normal in size and dominant. There is a 40-50% tubular stenosis in the midsegment. There is 40-50% tubular stenosis in the distal segment.   Left ventriculography: Left ventricular systolic function is normal , LVEF is estimated at 55 %, there is no significant mitral regurgitation  F/U myovue with no ischemia and normal EF 63% done 02/25/13  Diverticulitis with  bowel resection by Dr Grandville Silos 2015    Brillinta stopped with no complications   Recent trip back to [place] to see twin sister    ROS: Denies fever, malais, weight loss, blurry vision, decreased visual acuity, cough, sputum, SOB, hemoptysis, pleuritic pain, palpitaitons, heartburn, abdominal pain, melena, lower extremity edema, claudication, or rash.  All other systems reviewed and negative  General: Affect appropriate Healthy:  appears stated age 47: normal Neck supple with no adenopathy JVP normal no bruits no thyromegaly Lungs clear with no wheezing and good diaphragmatic motion Heart:  S1/S2 no murmur, no rub, gallop or click PMI normal Abdomen: benighn, BS positve, no tenderness, no AAA recent bowel surgery wounds healing well  no bruit.  No HSM or HJR Distal pulses intact with no bruits No edema Neuro non-focal Skin warm and dry No muscular  weakness   Current Outpatient Prescriptions  Medication Sig Dispense Refill  . Coenzyme Q10 (COQ-10) 200 MG CAPS Take 200 mg by mouth daily.    Marland Kitchen aspirin 81 MG chewable tablet Chew 81 mg by mouth daily.     Marland Kitchen ezetimibe (ZETIA) 10 MG tablet Take 10 mg by mouth every morning.    . metoprolol tartrate (LOPRESSOR) 25 MG tablet Take 25 mg by mouth 2 (two) times daily.     No current facility-administered medications for this visit.    Allergies  Statins; Sulfa antibiotics; and Advil  Electrocardiogram:  9/15  SR rate 96 inferolateral J point elevation  12/22/14  SR rate 63 normal   Assessment and Plan CAD:  Stent to circumflex 12/2012  Normal myovue 2015  Continue asa and beta blocker  Chol:  On zetia needs labs and referral to lipid clinic for praluent.  10 years ago had liver failure with statins  Diverticulitis:  Post surgery discussed low fiber diet.    F/u with me in a year   Jenkins Rouge

## 2014-12-22 ENCOUNTER — Encounter: Payer: Self-pay | Admitting: Cardiovascular Disease

## 2014-12-22 ENCOUNTER — Ambulatory Visit (INDEPENDENT_AMBULATORY_CARE_PROVIDER_SITE_OTHER): Payer: 59 | Admitting: Cardiovascular Disease

## 2014-12-22 VITALS — BP 132/86 | HR 63 | Ht 62.0 in | Wt 150.4 lb

## 2014-12-22 DIAGNOSIS — E785 Hyperlipidemia, unspecified: Secondary | ICD-10-CM | POA: Diagnosis not present

## 2014-12-22 DIAGNOSIS — Z79899 Other long term (current) drug therapy: Secondary | ICD-10-CM | POA: Diagnosis not present

## 2014-12-22 DIAGNOSIS — I10 Essential (primary) hypertension: Secondary | ICD-10-CM | POA: Diagnosis not present

## 2014-12-22 NOTE — Patient Instructions (Signed)
Medication Instructions:  Your physician recommends that you continue on your current medications as directed. Please refer to the Current Medication list given to you today.   Labwork: FASTING   LIPID AND LIVER    THIS  WEEK   Testing/Procedures: NONE  Follow-Up: Your physician wants you to follow-up in: YEAR  WITH DR [place] will receive a reminder letter in the mail two months in advance. If you don't receive a letter, please call our office to schedule the follow-up appointment.  Any Other Special Instructions Will Be Listed Below (If Applicable).     If you need a refill on your cardiac medications before your next appointment, please call your pharmacy.

## 2014-12-24 ENCOUNTER — Other Ambulatory Visit (INDEPENDENT_AMBULATORY_CARE_PROVIDER_SITE_OTHER): Payer: 59 | Admitting: *Deleted

## 2014-12-24 DIAGNOSIS — E785 Hyperlipidemia, unspecified: Secondary | ICD-10-CM | POA: Diagnosis not present

## 2014-12-24 DIAGNOSIS — Z79899 Other long term (current) drug therapy: Secondary | ICD-10-CM

## 2014-12-24 LAB — HEPATIC FUNCTION PANEL
ALBUMIN: 4.3 g/dL (ref 3.6–5.1)
ALK PHOS: 69 U/L (ref 33–130)
ALT: 76 U/L — AB (ref 6–29)
AST: 52 U/L — ABNORMAL HIGH (ref 10–35)
Bilirubin, Direct: 0.1 mg/dL (ref <=0.2)
Indirect Bilirubin: 0.3 mg/dL (ref 0.2–1.2)
TOTAL PROTEIN: 7.9 g/dL (ref 6.1–8.1)
Total Bilirubin: 0.4 mg/dL (ref 0.2–1.2)

## 2014-12-24 LAB — LIPID PANEL
Cholesterol: 237 mg/dL — ABNORMAL HIGH (ref 125–200)
HDL: 44 mg/dL — ABNORMAL LOW (ref >=46)
LDL Cholesterol: 150 mg/dL — ABNORMAL HIGH (ref <130)
TRIGLYCERIDES: 214 mg/dL — AB (ref <150)
Total CHOL/HDL Ratio: 5.4 Ratio — ABNORMAL HIGH (ref <=5.0)
VLDL: 43 mg/dL — ABNORMAL HIGH (ref <30)

## 2015-01-01 ENCOUNTER — Ambulatory Visit (INDEPENDENT_AMBULATORY_CARE_PROVIDER_SITE_OTHER): Payer: 59 | Admitting: Pharmacist

## 2015-01-01 DIAGNOSIS — E785 Hyperlipidemia, unspecified: Secondary | ICD-10-CM

## 2015-01-01 NOTE — Progress Notes (Signed)
Patient ID: Ashley Conley, female   DOB: [birth year], 64 y.o.   MRN: [id number]       HPI  Ashley Conley is a 64 yo pt of Dr. Johnsie Cancel who was referred to the [facility] for statin intolerance.  She has a PMH significant for NSTEMI s/p PCI to the circumflex in November 2014.  She had previously tried a statin ~10 years ago (she does not remember which one).  She states they checked her LFTs after one month and they were fine.  At her 6 month check-up, she states her enzymes were so elevated that they admitted her straight to the hospital. She did have a liver biopsy that was normal.  She has never had a rechallenge of statin since this episode.   RF: CAD s/p PCI LDL goal < 70, non-HDL goal <100  Current Therapy: Zetia 10mg  daily Intolerances- Statin (elevated LFT/ ? Liver failure)  Diet: Pt tries to be aware of her diet.  She is pre-diabetic and watches sugar and carbohydrates.  She is lactose intolerant.  Family History: Pt has an identical twin sister who lives in [place].  No cardiac events but diabetic.  Son has elevated cholesterol controlled with diet.   Labs  12/24/14- TC 237, TG 214, HDL 44, LDL 150, AST 52, ALT 76  Current Outpatient Prescriptions  Medication Sig Dispense Refill  . aspirin 81 MG chewable tablet Chew 81 mg by mouth daily.     . Coenzyme Q10 (COQ-10) 200 MG CAPS Take 200 mg by mouth daily.    Marland Kitchen ezetimibe (ZETIA) 10 MG tablet Take 10 mg by mouth every morning.    . metoprolol tartrate (LOPRESSOR) 25 MG tablet Take 25 mg by mouth 2 (two) times daily.    . nitroGLYCERIN (NITROSTAT) 0.4 MG SL tablet Place 0.4 mg under the tongue every 5 (five) minutes as needed for chest pain.    Marland Kitchen RA KRILL OIL 500 MG CAPS Take 500 mg by mouth daily.    . traMADol (ULTRAM) 50 MG tablet Take one - two (1-2) tablets (50 mg - 100 mg total) by mouth every six (6) hours as needed for pain.     No current facility-administered medications for this visit.    Allergies  Statins; Sulfa  antibiotics; and Advil   Past Medical History  Diagnosis Date  . Hypertension   . Hyperlipidemia     Statins caused to elevated LFTs  . NSTEMI (non-ST elevated myocardial infarction) ([place])   . CAD (coronary artery disease)   . Breast cancer ([place])     bilateral-23 years ago with reconstruction     Assessment and Plan 1.  Hyperlipidemia- Pt had a severe reaction to statin several years ago.  Agree with the severity of event that retry of statin would not be advisable.  She has been treated with Zetia for > 2 years and done well.  LDL still above goal.  Discussed role of PCSK-9 in treatment with patient.  She is agreeable to try therapy.  She will be changing insurance in January and does not know what plan that will be.  Will wait until she has her new plan before we pursue approval.  She will call us within the next few weeks for an update.   Ryder Chesmore R

## 2015-01-01 NOTE — Patient Instructions (Signed)
We will work to get you started on Praluent, the new cholesterol medication.   Once you have your new insurance information, please call Gay Filler at [phone].

## 2015-01-25 ENCOUNTER — Other Ambulatory Visit: Payer: Self-pay | Admitting: Cardiovascular Disease

## 2015-02-16 ENCOUNTER — Telehealth: Payer: Self-pay | Admitting: Pharmacist

## 2015-02-16 MED ORDER — ALIROCUMAB 75 MG/ML ~~LOC~~ SOPN: 1.0000 "pen " | PEN_INJECTOR | SUBCUTANEOUS | Status: DC

## 2015-02-16 NOTE — Telephone Encounter (Signed)
PA approved for Praluent injections. Rx sent to [place]. Pt will call when she receives first shipment of injections. Of note, she will be switching to Medicare in April 2017 and price of Praluent will increase then/new PA will have to be sent. Pt is aware.

## 2015-03-09 ENCOUNTER — Inpatient Hospital Stay (HOSPITAL_COMMUNITY)
Admission: EM | Admit: 2015-03-09 | Discharge: 2015-03-11 | DRG: 246 | Disposition: A | Discharge status: Home / Self Care | Payer: BLUE CROSS/BLUE SHIELD | Attending: Cardiovascular Disease | Admitting: Cardiovascular Disease

## 2015-03-09 ENCOUNTER — Encounter (HOSPITAL_COMMUNITY): Payer: Self-pay | Admitting: Emergency Medicine

## 2015-03-09 ENCOUNTER — Emergency Department (HOSPITAL_COMMUNITY): Payer: BLUE CROSS/BLUE SHIELD

## 2015-03-09 DIAGNOSIS — Z886 Allergy status to analgesic agent status: Secondary | ICD-10-CM

## 2015-03-09 DIAGNOSIS — Z7982 Long term (current) use of aspirin: Secondary | ICD-10-CM

## 2015-03-09 DIAGNOSIS — I200 Unstable angina: Secondary | ICD-10-CM | POA: Diagnosis present

## 2015-03-09 DIAGNOSIS — I25110 Atherosclerotic heart disease of native coronary artery with unstable angina pectoris: Secondary | ICD-10-CM | POA: Diagnosis not present

## 2015-03-09 DIAGNOSIS — I2582 Chronic total occlusion of coronary artery: Secondary | ICD-10-CM | POA: Diagnosis present

## 2015-03-09 DIAGNOSIS — Z79899 Other long term (current) drug therapy: Secondary | ICD-10-CM

## 2015-03-09 DIAGNOSIS — I252 Old myocardial infarction: Secondary | ICD-10-CM

## 2015-03-09 DIAGNOSIS — Z90710 Acquired absence of both cervix and uterus: Secondary | ICD-10-CM

## 2015-03-09 DIAGNOSIS — E785 Hyperlipidemia, unspecified: Secondary | ICD-10-CM | POA: Diagnosis not present

## 2015-03-09 DIAGNOSIS — Z853 Personal history of malignant neoplasm of breast: Secondary | ICD-10-CM

## 2015-03-09 DIAGNOSIS — Z888 Allergy status to other drugs, medicaments and biological substances status: Secondary | ICD-10-CM

## 2015-03-09 DIAGNOSIS — Z91011 Allergy to milk products: Secondary | ICD-10-CM

## 2015-03-09 DIAGNOSIS — I10 Essential (primary) hypertension: Secondary | ICD-10-CM | POA: Diagnosis present

## 2015-03-09 DIAGNOSIS — I2510 Atherosclerotic heart disease of native coronary artery without angina pectoris: Secondary | ICD-10-CM | POA: Diagnosis not present

## 2015-03-09 DIAGNOSIS — R739 Hyperglycemia, unspecified: Secondary | ICD-10-CM | POA: Diagnosis not present

## 2015-03-09 DIAGNOSIS — Z882 Allergy status to sulfonamides status: Secondary | ICD-10-CM

## 2015-03-09 DIAGNOSIS — I2542 Coronary artery dissection: Secondary | ICD-10-CM | POA: Diagnosis not present

## 2015-03-09 DIAGNOSIS — Z955 Presence of coronary angioplasty implant and graft: Secondary | ICD-10-CM

## 2015-03-09 DIAGNOSIS — R110 Nausea: Secondary | ICD-10-CM | POA: Diagnosis not present

## 2015-03-09 HISTORY — DX: Diverticulitis of intestine, part unspecified, without perforation or abscess without bleeding: K57.92

## 2015-03-09 HISTORY — DX: Hyperglycemia, unspecified: R73.9

## 2015-03-09 HISTORY — DX: Malignant neoplasm of unspecified site of right female breast: C50.911

## 2015-03-09 LAB — I-STAT TROPONIN, ED: Troponin i, poc: 0 ng/mL (ref 0.00–0.08)

## 2015-03-09 LAB — TROPONIN I

## 2015-03-09 LAB — CBC
HEMATOCRIT: 41.5 % (ref 36.0–46.0)
Hemoglobin: 14.2 g/dL (ref 12.0–15.0)
MCH: 32 pg (ref 26.0–34.0)
MCHC: 34.2 g/dL (ref 30.0–36.0)
MCV: 93.5 fL (ref 78.0–100.0)
PLATELETS: 306 10*3/uL (ref 150–400)
RBC: 4.44 MIL/uL (ref 3.87–5.11)
RDW: 12.5 % (ref 11.5–15.5)
WBC: 7.3 10*3/uL (ref 4.0–10.5)

## 2015-03-09 LAB — BASIC METABOLIC PANEL
Anion gap: 10 (ref 5–15)
BUN: 8 mg/dL (ref 6–20)
CHLORIDE: 104 mmol/L (ref 101–111)
CO2: 28 mmol/L (ref 22–32)
CREATININE: 0.68 mg/dL (ref 0.44–1.00)
Calcium: 10 mg/dL (ref 8.9–10.3)
GFR calc Af Amer: >60 mL/min (ref >60)
GFR calc non Af Amer: >60 mL/min (ref >60)
GLUCOSE: 127 mg/dL — AB (ref 65–99)
POTASSIUM: 4 mmol/L (ref 3.5–5.1)
SODIUM: 142 mmol/L (ref 135–145)

## 2015-03-09 MED ORDER — RA KRILL OIL 500 MG PO CAPS: 500.0000 mg | ORAL_CAPSULE | Freq: Every day | ORAL | Status: DC

## 2015-03-09 MED ORDER — NITROGLYCERIN 2 % TD OINT
1.0000 [in_us] | TOPICAL_OINTMENT | Freq: Once | TRANSDERMAL | Status: AC
Start: 2015-03-09 — End: 2015-03-09
  Administered 2015-03-09: 1 [in_us] via TOPICAL
  Filled 2015-03-09: qty 1

## 2015-03-09 MED ORDER — ADULT MULTIVITAMIN W/MINERALS CH
2.0000 | ORAL_TABLET | Freq: Every day | ORAL | Status: DC
  Filled 2015-03-09: qty 2

## 2015-03-09 MED ORDER — SODIUM CHLORIDE 0.9% FLUSH: 3.0000 mL | INTRAVENOUS | Status: DC | PRN

## 2015-03-09 MED ORDER — ASPIRIN 81 MG PO CHEW
162.0000 mg | CHEWABLE_TABLET | Freq: Once | ORAL | Status: AC
  Administered 2015-03-09: 162 mg via ORAL
  Filled 2015-03-09: qty 2

## 2015-03-09 MED ORDER — HEPARIN (PORCINE) IN NACL 100-0.45 UNIT/ML-% IJ SOLN
750.0000 [IU]/h | INTRAMUSCULAR | Status: DC
  Administered 2015-03-09: 750 [IU]/h via INTRAVENOUS
  Filled 2015-03-09: qty 250

## 2015-03-09 MED ORDER — SODIUM CHLORIDE 0.9 % WEIGHT BASED INFUSION: 1.0000 mL/kg/h | INTRAVENOUS | Status: DC

## 2015-03-09 MED ORDER — NITROGLYCERIN 0.4 MG SL SUBL: 0.4000 mg | SUBLINGUAL_TABLET | SUBLINGUAL | Status: DC | PRN

## 2015-03-09 MED ORDER — ACETAMINOPHEN 325 MG PO TABS: 650.0000 mg | ORAL_TABLET | ORAL | Status: DC | PRN

## 2015-03-09 MED ORDER — METOPROLOL TARTRATE 25 MG PO TABS
25.0000 mg | ORAL_TABLET | Freq: Two times a day (BID) | ORAL | Status: DC
  Administered 2015-03-09 – 2015-03-11 (×4): 25 mg via ORAL
  Filled 2015-03-09 (×4): qty 1

## 2015-03-09 MED ORDER — ASPIRIN 81 MG PO CHEW
81.0000 mg | CHEWABLE_TABLET | Freq: Every day | ORAL | Status: DC
  Administered 2015-03-10 – 2015-03-11 (×2): 81 mg via ORAL
  Filled 2015-03-09 (×2): qty 1

## 2015-03-09 MED ORDER — SODIUM CHLORIDE 0.9 % IV SOLN: 250.0000 mL | INTRAVENOUS | Status: DC | PRN

## 2015-03-09 MED ORDER — SODIUM CHLORIDE 0.9 % WEIGHT BASED INFUSION
3.0000 mL/kg/h | INTRAVENOUS | Status: DC
  Administered 2015-03-10: 3 mL/kg/h via INTRAVENOUS

## 2015-03-09 MED ORDER — COQ-10 200 MG PO CAPS: 200.0000 mg | ORAL_CAPSULE | Freq: Every evening | ORAL | Status: DC

## 2015-03-09 MED ORDER — SODIUM CHLORIDE 0.9% FLUSH
3.0000 mL | Freq: Two times a day (BID) | INTRAVENOUS | Status: DC
  Administered 2015-03-09: 3 mL via INTRAVENOUS

## 2015-03-09 MED ORDER — ONDANSETRON HCL 4 MG/2ML IJ SOLN
4.0000 mg | Freq: Four times a day (QID) | INTRAMUSCULAR | Status: DC | PRN
  Administered 2015-03-10 – 2015-03-11 (×2): 4 mg via INTRAVENOUS
  Filled 2015-03-09 (×2): qty 2

## 2015-03-09 MED ORDER — HEPARIN BOLUS VIA INFUSION
4000.0000 [IU] | Freq: Once | INTRAVENOUS | Status: AC
  Administered 2015-03-09: 4000 [IU] via INTRAVENOUS
  Filled 2015-03-09: qty 4000

## 2015-03-09 NOTE — ED Notes (Signed)
C/o CP since Friday, mild SOB, no other complaints, took 1 nitro with no relief, A/O X4, ambulatory and in NAD

## 2015-03-09 NOTE — ED Notes (Signed)
Public affairs consultant ordered @ [id number].

## 2015-03-09 NOTE — ED Notes (Signed)
Attempted report 

## 2015-03-09 NOTE — Consult Note (Signed)
Patient ID: Ashley Conley MRN: [id number], DOB/AGE: Jan 23, 1951   Admit date: 03/09/2015   Primary Physician: Imagene Riches, NP Primary Cardiologist: Dr. Johnsie Cancel  Pt. Profile:  The patient is a 65 year old Caucasian female with past medical history of HTN, uncontrolled HLD beginning PCSK9 inhibitor/Prulient, h/o diverticulitis with bowel resection in 10/2013 and CAD s/p DES to LCx in 12/2012 presented with intermittent R sided chest pain with exertion  Problem List  Past Medical History  Diagnosis Date  . Hypertension   . Hyperlipidemia     Statins caused to elevated LFTs  . NSTEMI (non-ST elevated myocardial infarction) ([place])   . CAD (coronary artery disease)   . Breast cancer ([place])     bilateral-23 years ago with reconstruction    Past Surgical History  Procedure Laterality Date  . Abdominal hysterectomy    . Reconstruction breast w/ tram flap      1992  . Breast surgery    . Liver biopsy      Performed for abnormal LFTs in the setting of statin use  . Bilateral salpingooophoroectomy Bilateral 2005  . Cardiac catheterization      with 1 stent  . Proctoscopy N/A 10/27/2013    Procedure: RIGID PROCTOSCOPY;  Surgeon: Leighton Ruff, MD;  Location: [facility] ORS;  Service: General;  Laterality: N/A;  . Left heart cath N/A 01/12/2013    Procedure: LEFT HEART CATH;  Surgeon: Wellington Hampshire, MD;  Location: [facility] CATH LAB;  Service: Cardiovascular;  Laterality: N/A;  . Percutaneous coronary stent intervention (pci-s)  01/12/2013    Procedure: PERCUTANEOUS CORONARY STENT INTERVENTION (PCI-S);  Surgeon: Wellington Hampshire, MD;  Location: [facility] CATH LAB;  Service: Cardiovascular;;  Mid CX DES  . Abdominal surgery       Allergies  Allergies  Allergen Reactions  . Statins Other (See Comments)    Liver failure from previously taking  . Sulfa Antibiotics Nausea And Vomiting  . Dairy Aid [Lactase] Other (See Comments)    Lactose intolerant   . Advil [Ibuprofen] Itching    HPI  The patient  is a 65 year old Caucasian female with past medical history of HTN, uncontrolled HLD beginning PCSK9 inhibitor/Prulient, h/o diverticulitis with bowel resection in 10/2013 and CAD s/p DES to LCx in 12/2012. Patient initially underwent cardiac catheterization on 01/12/2013 which showed 99% mid left circumflex treated with DES, small-caliber ostial 90% OM 2 jailed by the stent w/o EKG change or symptom, 80% mid to distal LAD treated medically. She had a negative Myoview in January 2015. She had liver failure on statin and was placed on Zetia. She returned in September 2015 with diverticulitis and underwent bowel resection. Brilinta stopped without further complication. She has been doing well since. She denies any bleeding problem. She denies any recent signs of infection including fever, chill, or cough.   For the past several weeks, she began to notice occasional chest discomfort during exertion occurring on a weekly basis. Since Friday, January 20, her chest discomfort began to occur more frequently. She states they usually occur with exertion accompanied by shortness of breath. She denies any shortness of breath at rest. The symptom is relieved by rest. She is not clear whether they are exacerbated by body rotation, deep inspiration or palpation. She says the symptom is slightly different from the previous MI, her MI symptom was much more severe and the pain was located in central chest associated with nausea and vomiting. This time, the chest pain is more located what the right described  as a pressure-like sensation, however still occur with exertion which is concerning.   She eventually sought medical attention at [facility] on 03/09/2015. Initial troponin was negative. Creatinine 0.68. Hemoglobin 14.2. Chest x-ray was negative for acute process. EKG showed no significant ST-T wave changes.    Home Medications  Prior to Admission medications   Medication Sig Start Date End Date Taking?  Authorizing Provider  Alirocumab (PRALUENT) 75 MG/ML SOPN Inject 1 pen into the skin every 14 (fourteen) days. 02/16/15   Josue Hector, MD  aspirin 81 MG chewable tablet Chew 81 mg by mouth daily.     Historical Provider, MD  Coenzyme Q10 (COQ-10) 200 MG CAPS Take 200 mg by mouth daily.    Historical Provider, MD  metoprolol tartrate (LOPRESSOR) 25 MG tablet Take 25 mg by mouth 2 (two) times daily.    Historical Provider, MD  nitroGLYCERIN (NITROSTAT) 0.4 MG SL tablet Place 0.4 mg under the tongue every 5 (five) minutes as needed for chest pain.    Historical Provider, MD  RA KRILL OIL 500 MG CAPS Take 500 mg by mouth daily.    Historical Provider, MD  traMADol (ULTRAM) 50 MG tablet Take one - two (1-2) tablets (50 mg - 100 mg total) by mouth every six (6) hours as needed for pain.    Historical Provider, MD  ZETIA 10 MG tablet TAKE ONE TABLET BY MOUTH ONCE DAILY 01/26/15   Josue Hector, MD    Family History  Family History  Problem Relation Age of Onset  . Heart disease Father   . Healthy Mother     Social History  Social History   Social History  . Marital Status: Married    Spouse Name: N/A  . Number of Children: N/A  . Years of Education: N/A   Occupational History  . Not on file.   Social History Main Topics  . Smoking status: Never Smoker   . Smokeless tobacco: Never Used  . Alcohol Use: No  . Drug Use: No  . Sexual Activity: Yes    Birth Control/ Protection: Post-menopausal   Other Topics Concern  . Not on file   Social History Narrative     Review of Systems General:  No chills, fever, night sweats or weight changes.  Cardiovascular:  No edema, orthopnea, palpitations, paroxysmal nocturnal dyspnea. +chest pain, dyspnea on exertion Dermatological: No rash, lesions/masses Respiratory: No cough, dyspnea Urologic: No hematuria, dysuria Abdominal:   No nausea, vomiting, diarrhea, bright red blood per rectum, melena, or hematemesis Neurologic:  No visual  changes, wkns, changes in mental status. All other systems reviewed and are otherwise negative except as noted above.  Physical Exam  Blood pressure 124/67, pulse 57, temperature 97.7 F (36.5 C), temperature source Oral, resp. rate 18, height 5\' 2"  (1.575 m), weight 150 lb (68.04 kg), SpO2 99 %.  General: Pleasant, NAD Psych: Normal affect. Neuro: Alert and oriented X 3. Moves all extremities spontaneously. HEENT: Normal  Neck: Supple without bruits or JVD. Lungs:  Resp regular and unlabored, CTA. Heart: RRR no s3, s4, or murmurs. Abdomen: Soft, non-tender, non-distended, BS + x 4.  Extremities: No clubbing, cyanosis or edema. DP/PT/Radials 2+ and equal bilaterally.  Labs  Troponin [facility] Test)  Recent Labs  03/09/15 1214  TROPIPOC 0.00   No results for input(s): CKTOTAL, CKMB, TROPONINI in the last 72 hours. Lab Results  Component Value Date   WBC 7.3 03/09/2015   HGB 14.2 03/09/2015  HCT 41.5 03/09/2015   MCV 93.5 03/09/2015   PLT 306 03/09/2015     Recent Labs Lab 03/09/15 1200  NA 142  K 4.0  CL 104  CO2 28  BUN 8  CREATININE 0.68  CALCIUM 10.0  GLUCOSE 127*   Lab Results  Component Value Date   CHOL 237* 12/24/2014   HDL 44* 12/24/2014   LDLCALC 150* 12/24/2014   TRIG 214* 12/24/2014   No results found for: DDIMER   Radiology/Studies  Dg Chest 2 View  03/09/2015  CLINICAL DATA:  Chest pain for 2 days. EXAM: CHEST  2 VIEW COMPARISON:  09/17/2013 FINDINGS: Clips project over both breasts and in the right axillary region. The lungs appear clear. Cardiac and mediastinal contours normal. No pleural effusion identified. IMPRESSION: 1.  No active cardiopulmonary disease is radiographically apparent. Electronically Signed   By: Van Clines M.D.   On: 03/09/2015 13:11    ECG  NSR without significant ST-T wave changes, mild J point elevation likely early repol   ASSESSMENT AND PLAN  1. Exertional symptom with known significant residual  in mid-dist LAD  - despite her current symptom is slightly different from previous MI, however her symptom is very classic for crescendo/unstable angina. She has known 80% mid to distal LAD disease  - discussed with MD, plan for cardiac catheterization tomorrow. Risk and benefit of procedure explained to the patient who display clear understanding and agree to proceed. Discussed with patient possible procedural risk include bleeding, vascular injury, renal injury, arrythmia, MI, stroke and loss of limb or life.  2. CAD s/p DES to LCx in 12/2012  3. HTN  4. uncontrolled HLD beginning PCSK9 inhibitor/Prulient  - no statin given h/o liver failure on statin  - plan to start PCSK9 inhibitor as outpatient, already approved by insurance  5. h/o diverticulitis with bowel resection in 10/2013    SignedAlmyra Deforest, PA-C 03/09/2015, 5:49 PM  Personally seen and examined. Agree with above. 65 year old with prior Circ stent, residual distal LAD dz here with unstable angina. Worsening symptoms with exertion. Pressure like.    - Plan cath. Discussed risks  - Trop thus far normal   - No statin. Has Praulent at home but was hesitant to take first dose.   Candee Furbish, MD

## 2015-03-09 NOTE — Progress Notes (Signed)
ANTICOAGULATION CONSULT NOTE - Initial Consult  Pharmacy Consult for Heparin Indication: chest pain/ACS  Allergies  Allergen Reactions  . Statins Other (See Comments)    Liver failure from previously taking  . Sulfa Antibiotics Nausea And Vomiting  . Dairy Aid [Lactase] Other (See Comments)    Lactose intolerant   . Advil [Ibuprofen] Itching    Patient Measurements: Height: 5\' 2"  (157.5 cm) Weight: 150 lb (68.04 kg) IBW/kg (Calculated) : 50.1 Heparin Dosing Weight: 64 kg  Vital Signs: Temp: 97.7 F (36.5 C) (01/24 1444) Temp Source: Oral (01/24 1444) BP: 141/88 mmHg (01/24 1900) Pulse Rate: 91 (01/24 2003)  Labs:  Recent Labs  03/09/15 1200  HGB 14.2  HCT 41.5  PLT 306  CREATININE 0.68    Estimated Creatinine Clearance: 64.3 mL/min (by C-G formula based on Cr of 0.68).   Medical History: Past Medical History  Diagnosis Date  . Hypertension   . Hyperlipidemia     Statins caused to elevated LFTs  . CAD (coronary artery disease)   . Anginal pain ([place])   . NSTEMI (non-ST elevated myocardial infarction) ([place]) 12/2012  . Cancer of right breast ([place]) 1992    Medications:  Prescriptions prior to admission  Medication Sig Dispense Refill Last Dose  . aspirin 81 MG chewable tablet Chew 81 mg by mouth daily.    03/09/2015 at Unknown time  . Coenzyme Q10 (COQ-10) 200 MG CAPS Take 200 mg by mouth every evening.    03/08/2015 at Unknown time  . metoprolol tartrate (LOPRESSOR) 25 MG tablet Take 25 mg by mouth 2 (two) times daily.   03/09/2015 at 0830  . Multiple Vitamin (MULTIVITAMIN) tablet Take 2 tablets by mouth daily.   03/09/2015 at Unknown time  . nitroGLYCERIN (NITROSTAT) 0.4 MG SL tablet Place 0.4 mg under the tongue every 5 (five) minutes as needed for chest pain.   PRN  . RA KRILL OIL 500 MG CAPS Take 500 mg by mouth daily.   03/08/2015 at Unknown time  . Alirocumab (PRALUENT) 75 MG/ML SOPN Inject 1 pen into the skin every 14 (fourteen) days. 2 pen 11   . ZETIA  10 MG tablet TAKE ONE TABLET BY MOUTH ONCE DAILY 30 tablet 10 03/07/2015   Scheduled:  . aspirin  81 mg Oral Daily  . CoQ-10  200 mg Oral QPM  . metoprolol tartrate  25 mg Oral BID  . multivitamin  2 tablet Oral Daily  . RA KRILL OIL  500 mg Oral Daily  . sodium chloride flush  3 mL Intravenous Q12H   Infusions:  . [START ON 03/10/2015] sodium chloride     Followed by  . [START ON 03/10/2015] sodium chloride      Assessment: 65yo female presents with intermittent R sided CP with exertion. Pharmacy is consulted to dose heparin for ACS/chest pain. Trop neg x1.  Goal of Therapy:  Heparin level 0.3-0.7 units/ml Monitor platelets by anticoagulation protocol: Yes   Plan:  Give 4000 units bolus x 1 Start heparin infusion at 750 units/hr Check anti-Xa level in 6 hours and daily while on heparin Continue to monitor H&H and platelets  Andrey Cota. Diona Foley, PharmD, [place] Clinical Pharmacist Pager [phone] 03/09/2015,9:31 PM

## 2015-03-09 NOTE — ED Provider Notes (Signed)
CSN: [id number]     Arrival date & time 03/09/15  1141 History   First MD Initiated Contact with Patient 03/09/15 1631     Chief Complaint  Patient presents with  . Chest Pain     (Consider location/radiation/quality/duration/timing/severity/associated sxs/prior Treatment) HPI Patient has history of coronary artery disease with MI in 11/14 with stenting. She has been developing intermittent chest pain, increasing in frequency over the past 4 days. She states it is most pronounced with exertion and improves with rest. It is associated dyspnea. Pain is to the right of her sternum and a dull aching quality. She took a nitroglycerin prior to arrival to the emergency department. She is uncertain if it helped because she has been predominantly at rest since taking it. She has otherwise been well. No fever or cough. No lower extremity swelling or pain. No GI symptoms. Patient takes daily aspirin. He took aspirin this morning. Past Medical History  Diagnosis Date  . Hypertension   . Hyperlipidemia     Statins caused to elevated LFTs  . NSTEMI (non-ST elevated myocardial infarction) ([place])   . CAD (coronary artery disease)   . Breast cancer ([place])     bilateral-23 years ago with reconstruction   Past Surgical History  Procedure Laterality Date  . Abdominal hysterectomy    . Reconstruction breast w/ tram flap      1992  . Breast surgery    . Liver biopsy      Performed for abnormal LFTs in the setting of statin use  . Bilateral salpingooophoroectomy Bilateral 2005  . Cardiac catheterization      with 1 stent  . Proctoscopy N/A 10/27/2013    Procedure: RIGID PROCTOSCOPY;  Surgeon: Leighton Ruff, MD;  Location: [facility] ORS;  Service: General;  Laterality: N/A;  . Left heart cath N/A 01/12/2013    Procedure: LEFT HEART CATH;  Surgeon: Wellington Hampshire, MD;  Location: [facility] CATH LAB;  Service: Cardiovascular;  Laterality: N/A;  . Percutaneous coronary stent intervention (pci-s)  01/12/2013    Procedure:  PERCUTANEOUS CORONARY STENT INTERVENTION (PCI-S);  Surgeon: Wellington Hampshire, MD;  Location: [facility] CATH LAB;  Service: Cardiovascular;;  Mid CX DES  . Abdominal surgery     Family History  Problem Relation Age of Onset  . Heart disease Father   . Healthy Mother    Social History  Substance Use Topics  . Smoking status: Never Smoker   . Smokeless tobacco: Never Used  . Alcohol Use: No   OB History    No data available     Review of Systems  10 Systems reviewed and are negative for acute change except as noted in the HPI.   Allergies  Statins; Sulfa antibiotics; Dairy aid; and Advil  Home Medications   Prior to Admission medications   Medication Sig Start Date End Date Taking? Authorizing Provider  aspirin 81 MG chewable tablet Chew 81 mg by mouth daily.    Yes Historical Provider, MD  Coenzyme Q10 (COQ-10) 200 MG CAPS Take 200 mg by mouth every evening.    Yes Historical Provider, MD  metoprolol tartrate (LOPRESSOR) 25 MG tablet Take 25 mg by mouth 2 (two) times daily.   Yes Historical Provider, MD  Multiple Vitamin (MULTIVITAMIN) tablet Take 2 tablets by mouth daily.   Yes Historical Provider, MD  nitroGLYCERIN (NITROSTAT) 0.4 MG SL tablet Place 0.4 mg under the tongue every 5 (five) minutes as needed for chest pain.   Yes Historical Provider, MD  RA KRILL OIL 500 MG CAPS Take 500 mg by mouth daily.   Yes Historical Provider, MD  Alirocumab (PRALUENT) 75 MG/ML SOPN Inject 1 pen into the skin every 14 (fourteen) days. 02/16/15   Josue Hector, MD  ZETIA 10 MG tablet TAKE ONE TABLET BY MOUTH ONCE DAILY 01/26/15   Josue Hector, MD   BP 124/67 mmHg  Pulse 57  Temp(Src) 97.7 F (36.5 C) (Oral)  Resp 18  Ht 5\' 2"  (1.575 m)  Wt 150 lb (68.04 kg)  BMI 27.43 kg/m2  SpO2 99% Physical Exam  Constitutional: She is oriented to person, place, and time. She appears well-developed and well-nourished.  HENT:  Head: Normocephalic and atraumatic.  Eyes: EOM are normal. Pupils are  equal, round, and reactive to light.  Neck: Neck supple.  Cardiovascular: Normal rate, regular rhythm, normal heart sounds and intact distal pulses.   Pulmonary/Chest: Effort normal and breath sounds normal. No respiratory distress. She has no wheezes. She has no rales.  Abdominal: Soft. Bowel sounds are normal. She exhibits no distension. There is no tenderness.  Musculoskeletal: Normal range of motion. She exhibits no edema or tenderness.  Neurological: She is alert and oriented to person, place, and time. She has normal strength. Coordination normal. GCS eye subscore is 4. GCS verbal subscore is 5. GCS motor subscore is 6.  Skin: Skin is warm, dry and intact.  Psychiatric: She has a normal mood and affect.    ED Course  Procedures (including critical care time) Labs Review Labs Reviewed  BASIC METABOLIC PANEL - Abnormal; Notable for the following:    Glucose, Bld 127 (*)    All other components within normal limits  CBC  I-STAT TROPOININ, ED    Imaging Review Dg Chest 2 View  03/09/2015  CLINICAL DATA:  Chest pain for 2 days. EXAM: CHEST  2 VIEW COMPARISON:  09/17/2013 FINDINGS: Clips project over both breasts and in the right axillary region. The lungs appear clear. Cardiac and mediastinal contours normal. No pleural effusion identified. IMPRESSION: 1.  No active cardiopulmonary disease is radiographically apparent. Electronically Signed   By: Van Clines M.D.   On: 03/09/2015 13:11   I have personally reviewed and evaluated these images and lab results as part of my medical decision-making.   EKG Interpretation   Date/Time:  Tuesday March 09 2015 11:43:59 EST Ventricular Rate:  65 PR Interval:  152 QRS Duration: 98 QT Interval:  398 QTC Calculation: 413 R Axis:   76 Text Interpretation:  Normal sinus rhythm Normal ECG AGREE. NO CHANGE FROM  OLD Confirmed by Johnney Killian, MD, Jeannie Done [phone]) on 03/09/2015 4:32:08 PM     : Patient's case is consult with cardiology MDM    Final diagnoses:  Unstable angina [facility])   Patient has known coronary artery disease. She's been experiencing exertional dyspnea and chest pain. This is concerning for unstable angina. At this time there are no acute changes on EKG and first cardiac enzymes are negative. She has stable vital signs.     Charlesetta Shanks, MD 03/09/15 1840

## 2015-03-09 NOTE — ED Notes (Signed)
Pt made aware of bed assignment 

## 2015-03-09 NOTE — ED Notes (Signed)
Cardiology at bedside.

## 2015-03-10 ENCOUNTER — Encounter (HOSPITAL_COMMUNITY)
Admission: EM | Disposition: A | Discharge status: Home / Self Care | Payer: Self-pay | Source: Home / Self Care | Attending: Cardiovascular Disease

## 2015-03-10 DIAGNOSIS — Z955 Presence of coronary angioplasty implant and graft: Secondary | ICD-10-CM | POA: Diagnosis not present

## 2015-03-10 DIAGNOSIS — I200 Unstable angina: Secondary | ICD-10-CM | POA: Diagnosis present

## 2015-03-10 DIAGNOSIS — I252 Old myocardial infarction: Secondary | ICD-10-CM | POA: Diagnosis not present

## 2015-03-10 DIAGNOSIS — R110 Nausea: Secondary | ICD-10-CM | POA: Diagnosis not present

## 2015-03-10 DIAGNOSIS — I25110 Atherosclerotic heart disease of native coronary artery with unstable angina pectoris: Secondary | ICD-10-CM | POA: Diagnosis present

## 2015-03-10 DIAGNOSIS — Z882 Allergy status to sulfonamides status: Secondary | ICD-10-CM | POA: Diagnosis not present

## 2015-03-10 DIAGNOSIS — Z90710 Acquired absence of both cervix and uterus: Secondary | ICD-10-CM | POA: Diagnosis not present

## 2015-03-10 DIAGNOSIS — I10 Essential (primary) hypertension: Secondary | ICD-10-CM

## 2015-03-10 DIAGNOSIS — Z886 Allergy status to analgesic agent status: Secondary | ICD-10-CM | POA: Diagnosis not present

## 2015-03-10 DIAGNOSIS — E785 Hyperlipidemia, unspecified: Secondary | ICD-10-CM | POA: Diagnosis not present

## 2015-03-10 DIAGNOSIS — R739 Hyperglycemia, unspecified: Secondary | ICD-10-CM | POA: Diagnosis present

## 2015-03-10 DIAGNOSIS — Z79899 Other long term (current) drug therapy: Secondary | ICD-10-CM | POA: Diagnosis not present

## 2015-03-10 DIAGNOSIS — Z7982 Long term (current) use of aspirin: Secondary | ICD-10-CM | POA: Diagnosis not present

## 2015-03-10 DIAGNOSIS — I2582 Chronic total occlusion of coronary artery: Secondary | ICD-10-CM | POA: Diagnosis present

## 2015-03-10 DIAGNOSIS — Z853 Personal history of malignant neoplasm of breast: Secondary | ICD-10-CM | POA: Diagnosis not present

## 2015-03-10 DIAGNOSIS — Z888 Allergy status to other drugs, medicaments and biological substances status: Secondary | ICD-10-CM | POA: Diagnosis not present

## 2015-03-10 DIAGNOSIS — I2542 Coronary artery dissection: Secondary | ICD-10-CM | POA: Diagnosis not present

## 2015-03-10 DIAGNOSIS — Z91011 Allergy to milk products: Secondary | ICD-10-CM | POA: Diagnosis not present

## 2015-03-10 HISTORY — PX: CARDIAC CATHETERIZATION: SHX172

## 2015-03-10 LAB — CBC
HCT: 40.7 % (ref 36.0–46.0)
HEMOGLOBIN: 13.6 g/dL (ref 12.0–15.0)
MCH: 31.3 pg (ref 26.0–34.0)
MCHC: 33.4 g/dL (ref 30.0–36.0)
MCV: 93.8 fL (ref 78.0–100.0)
PLATELETS: 281 10*3/uL (ref 150–400)
RBC: 4.34 MIL/uL (ref 3.87–5.11)
RDW: 12.6 % (ref 11.5–15.5)
WBC: 9.3 10*3/uL (ref 4.0–10.5)

## 2015-03-10 LAB — BASIC METABOLIC PANEL WITH GFR
Anion gap: 8 (ref 5–15)
BUN: 12 mg/dL (ref 6–20)
CO2: 27 mmol/L (ref 22–32)
Calcium: 8.7 mg/dL — ABNORMAL LOW (ref 8.9–10.3)
Chloride: 98 mmol/L — ABNORMAL LOW (ref 101–111)
Creatinine, Ser: 0.79 mg/dL (ref 0.44–1.00)
GFR calc Af Amer: >60 mL/min
GFR calc non Af Amer: >60 mL/min
Glucose, Bld: 116 mg/dL — ABNORMAL HIGH (ref 65–99)
Potassium: 3.7 mmol/L (ref 3.5–5.1)
Sodium: 133 mmol/L — ABNORMAL LOW (ref 135–145)

## 2015-03-10 LAB — LIPID PANEL
CHOL/HDL RATIO: 6.1 ratio
Cholesterol: 252 mg/dL — ABNORMAL HIGH (ref 0–200)
HDL: 41 mg/dL (ref >40)
LDL CALC: 158 mg/dL — AB (ref 0–99)
TRIGLYCERIDES: 265 mg/dL — AB (ref <150)
VLDL: 53 mg/dL — AB (ref 0–40)

## 2015-03-10 LAB — TROPONIN I
TROPONIN I: 0.57 ng/mL — AB (ref <0.031)
Troponin I: <0.03 ng/mL (ref <0.031)
Troponin I: <0.03 ng/mL (ref <0.031)

## 2015-03-10 LAB — PROTIME-INR
INR: 1.14 (ref 0.00–1.49)
Prothrombin Time: 14.8 s (ref 11.6–15.2)

## 2015-03-10 LAB — HEPARIN LEVEL (UNFRACTIONATED): Heparin Unfractionated: 0.32 [IU]/mL (ref 0.30–0.70)

## 2015-03-10 LAB — POCT ACTIVATED CLOTTING TIME: ACTIVATED CLOTTING TIME: 636 s

## 2015-03-10 SURGERY — LEFT HEART CATH AND CORONARY ANGIOGRAPHY
Anesthesia: LOCAL

## 2015-03-10 MED ORDER — ADULT MULTIVITAMIN W/MINERALS CH
1.0000 | ORAL_TABLET | Freq: Every day | ORAL | Status: DC
  Administered 2015-03-10 – 2015-03-11 (×2): 1 via ORAL
  Filled 2015-03-10 (×2): qty 1

## 2015-03-10 MED ORDER — NITROGLYCERIN IN D5W 200-5 MCG/ML-% IV SOLN
INTRAVENOUS | Status: AC
  Filled 2015-03-10: qty 250

## 2015-03-10 MED ORDER — SODIUM CHLORIDE 0.9 % IV SOLN
250.0000 mg | INTRAVENOUS | Status: DC | PRN
  Administered 2015-03-10: 1.75 mg/kg/h via INTRAVENOUS

## 2015-03-10 MED ORDER — TICAGRELOR 90 MG PO TABS
90.0000 mg | ORAL_TABLET | Freq: Two times a day (BID) | ORAL | Status: DC
  Administered 2015-03-11: 90 mg via ORAL
  Filled 2015-03-10: qty 1

## 2015-03-10 MED ORDER — HEPARIN (PORCINE) IN NACL 2-0.9 UNIT/ML-% IJ SOLN
INTRAMUSCULAR | Status: AC
  Filled 2015-03-10: qty 1000

## 2015-03-10 MED ORDER — MIDAZOLAM HCL 2 MG/2ML IJ SOLN
INTRAMUSCULAR | Status: AC
  Filled 2015-03-10: qty 2

## 2015-03-10 MED ORDER — SODIUM CHLORIDE 0.9% FLUSH: 3.0000 mL | Freq: Two times a day (BID) | INTRAVENOUS | Status: DC

## 2015-03-10 MED ORDER — MORPHINE SULFATE (PF) 2 MG/ML IV SOLN
2.0000 mg | INTRAVENOUS | Status: DC | PRN
  Administered 2015-03-11: 2 mg via INTRAVENOUS
  Filled 2015-03-10: qty 1

## 2015-03-10 MED ORDER — NITROGLYCERIN IN D5W 200-5 MCG/ML-% IV SOLN
0.0000 ug/min | INTRAVENOUS | Status: DC
  Administered 2015-03-10: 5 ug/min via INTRAVENOUS
  Administered 2015-03-10: 18:00:00 10 ug/min via INTRAVENOUS

## 2015-03-10 MED ORDER — BIVALIRUDIN 250 MG IV SOLR
INTRAVENOUS | Status: AC
  Filled 2015-03-10: qty 250

## 2015-03-10 MED ORDER — SODIUM CHLORIDE 0.9% FLUSH: 3.0000 mL | INTRAVENOUS | Status: DC | PRN

## 2015-03-10 MED ORDER — TICAGRELOR 90 MG PO TABS
ORAL_TABLET | ORAL | Status: AC
  Filled 2015-03-10: qty 1

## 2015-03-10 MED ORDER — VERAPAMIL HCL 2.5 MG/ML IV SOLN
INTRAVENOUS | Status: AC
  Filled 2015-03-10: qty 2

## 2015-03-10 MED ORDER — FENTANYL CITRATE (PF) 100 MCG/2ML IJ SOLN
INTRAMUSCULAR | Status: AC
  Filled 2015-03-10: qty 2

## 2015-03-10 MED ORDER — IOHEXOL 350 MG/ML SOLN
INTRAVENOUS | Status: DC | PRN
  Administered 2015-03-10: 265 mL via INTRAVENOUS

## 2015-03-10 MED ORDER — SODIUM CHLORIDE 0.9 % IV SOLN
INTRAVENOUS | Status: DC
  Administered 2015-03-10: 18:00:00 via INTRAVENOUS

## 2015-03-10 MED ORDER — NITROGLYCERIN 1 MG/10 ML FOR IR/CATH LAB
INTRA_ARTERIAL | Status: AC
  Filled 2015-03-10: qty 10

## 2015-03-10 MED ORDER — FENTANYL CITRATE (PF) 100 MCG/2ML IJ SOLN
INTRAMUSCULAR | Status: DC | PRN
  Administered 2015-03-10 (×4): 25 ug via INTRAVENOUS

## 2015-03-10 MED ORDER — NITROGLYCERIN 1 MG/10 ML FOR IR/CATH LAB
INTRA_ARTERIAL | Status: DC | PRN
  Administered 2015-03-10 (×3): 200 ug via INTRACORONARY

## 2015-03-10 MED ORDER — ASPIRIN EC 81 MG PO TBEC: 81.0000 mg | DELAYED_RELEASE_TABLET | Freq: Every day | ORAL | Status: DC

## 2015-03-10 MED ORDER — MIDAZOLAM HCL 2 MG/2ML IJ SOLN
INTRAMUSCULAR | Status: DC | PRN
  Administered 2015-03-10 (×4): 1 mg via INTRAVENOUS
  Administered 2015-03-10: 2 mg via INTRAVENOUS

## 2015-03-10 MED ORDER — HEPARIN SODIUM (PORCINE) 1000 UNIT/ML IJ SOLN
INTRAMUSCULAR | Status: DC | PRN
  Administered 2015-03-10: 3500 [IU] via INTRAVENOUS

## 2015-03-10 MED ORDER — NITROGLYCERIN 1 MG/10 ML FOR IR/CATH LAB
INTRA_ARTERIAL | Status: DC | PRN
  Administered 2015-03-10: 17:00:00

## 2015-03-10 MED ORDER — LIDOCAINE HCL (PF) 1 % IJ SOLN
INTRAMUSCULAR | Status: AC
  Filled 2015-03-10: qty 30

## 2015-03-10 MED ORDER — BIVALIRUDIN 250 MG IV SOLR
0.2500 mg/kg/h | INTRAVENOUS | Status: AC
  Administered 2015-03-10: 19:00:00 0.25 mg/kg/h via INTRAVENOUS
  Filled 2015-03-10: qty 250

## 2015-03-10 MED ORDER — ANGIOPLASTY BOOK
Freq: Once | Status: AC
  Administered 2015-03-10: 20:00:00
  Filled 2015-03-10: qty 1

## 2015-03-10 MED ORDER — ACETAMINOPHEN 325 MG PO TABS: 650.0000 mg | ORAL_TABLET | ORAL | Status: DC | PRN

## 2015-03-10 MED ORDER — HEPARIN SODIUM (PORCINE) 1000 UNIT/ML IJ SOLN
INTRAMUSCULAR | Status: AC
  Filled 2015-03-10: qty 1

## 2015-03-10 MED ORDER — VERAPAMIL HCL 2.5 MG/ML IV SOLN
INTRA_ARTERIAL | Status: DC | PRN
  Administered 2015-03-10: 15 mL via INTRA_ARTERIAL

## 2015-03-10 MED ORDER — SODIUM CHLORIDE 0.9 % IV SOLN: 250.0000 mL | INTRAVENOUS | Status: DC | PRN

## 2015-03-10 MED ORDER — ONDANSETRON HCL 4 MG/2ML IJ SOLN: 4.0000 mg | Freq: Four times a day (QID) | INTRAMUSCULAR | Status: DC | PRN

## 2015-03-10 MED ORDER — TICAGRELOR 90 MG PO TABS
ORAL_TABLET | ORAL | Status: DC | PRN
  Administered 2015-03-10: 180 mg via ORAL

## 2015-03-10 MED ORDER — BIVALIRUDIN BOLUS VIA INFUSION - CUPID
INTRAVENOUS | Status: DC | PRN
  Administered 2015-03-10: 51.975 mg via INTRAVENOUS

## 2015-03-10 MED ORDER — NITROGLYCERIN IN D5W 200-5 MCG/ML-% IV SOLN
INTRAVENOUS | Status: DC | PRN
  Administered 2015-03-10: 10 ug/min via INTRAVENOUS

## 2015-03-10 SURGICAL SUPPLY — 27 items
BALLN EMERGE MR 2.5X30 (BALLOONS) ×2
BALLN EUPHORA RX 2.0X12 (BALLOONS) ×2
BALLN TREK RX 2.25X20 (BALLOONS) ×2
BALLN ~~LOC~~ TREK RX 2.5X20 (BALLOONS) ×2
BALLOON EMERGE MR 2.5X30 (BALLOONS) ×1 IMPLANT
BALLOON EUPHORA RX 2.0X12 (BALLOONS) ×1 IMPLANT
BALLOON TREK RX 2.25X20 (BALLOONS) ×1 IMPLANT
BALLOON ~~LOC~~ TREK RX 2.5X20 (BALLOONS) ×1 IMPLANT
CATH INFINITI 5 FR JL3.5 (CATHETERS) ×2 IMPLANT
CATH INFINITI 5FR ANG PIGTAIL (CATHETERS) ×2 IMPLANT
CATH INFINITI JR4 5F (CATHETERS) ×2 IMPLANT
CATH LAUNCHER 5F RADL (CATHETERS) ×1 IMPLANT
CATH VISTA GUIDE 6FR XBLAD3.5 (CATHETERS) ×2 IMPLANT
CATHETER LAUNCHER 5F RADL (CATHETERS) ×2
DEVICE RAD COMP TR BAND LRG (VASCULAR PRODUCTS) ×2 IMPLANT
GLIDESHEATH SLEND A-KIT 6F 22G (SHEATH) ×2 IMPLANT
KIT ENCORE 26 ADVANTAGE (KITS) ×2 IMPLANT
KIT HEART LEFT (KITS) ×2 IMPLANT
PACK CARDIAC CATHETERIZATION (CUSTOM PROCEDURE TRAY) ×2 IMPLANT
STENT XIENCE ALPINE RX 2.5X38 (Permanent Stent) ×2 IMPLANT
SYR MEDRAD MARK V 150ML (SYRINGE) ×2 IMPLANT
TRANSDUCER W/STOPCOCK (MISCELLANEOUS) ×2 IMPLANT
TUBING CIL FLEX 10 FLL-RA (TUBING) ×2 IMPLANT
WIRE ASAHI PROWATER 180CM (WIRE) ×2 IMPLANT
WIRE HI TORQ WHISPER MS 190CM (WIRE) IMPLANT
WIRE PT2 MS 185 (WIRE) ×2 IMPLANT
WIRE SAFE-T 1.5MM-J .035X260CM (WIRE) ×2 IMPLANT

## 2015-03-10 NOTE — Progress Notes (Signed)
CM to  provide pt with Brilinta booklet with 30 day free/copay card enclosed in am . CM called [facility]/[place] ,[place] and confirmed medication is in stock. CM to f/u with pt in am.

## 2015-03-10 NOTE — Progress Notes (Signed)
  Patient: Ashley Conley / Admit Date: 03/09/2015 / Date of Encounter: 03/10/2015, 7:57 AM   Subjective: Had slight CP overnight, now CP free. EKG nonacute. No SOB.   Objective: Telemetry: NSR (nocturnal SB briefly 49s) Physical Exam: Blood pressure 115/73, pulse 64, temperature 97.9 F (36.6 C), temperature source Oral, resp. rate 18, height 5\' 2"  (1.575 m), weight 152 lb 11.2 oz (69.264 kg), SpO2 96 %. General: Well developed, well nourished WF in no acute distress. Lying flat in bed without acute SOB. Head: Normocephalic, atraumatic, sclera non-icteric, no xanthomas, nares are without discharge. Neck: Negative for carotid bruits. JVP not elevated. Lungs: Clear bilaterally to auscultation without wheezes, rales, or rhonchi. Breathing is unlabored. Heart: RRR S1 S2 without murmurs, rubs, or gallops.  Abdomen: Soft, non-tender, non-distended with normoactive bowel sounds. No rebound/guarding. Extremities: No clubbing or cyanosis. No edema. Distal pedal pulses are 2+ and equal bilaterally. Neuro: Alert and oriented X 3. Moves all extremities spontaneously. Psych:  Responds to questions appropriately with a normal affect.  No intake or output data in the 24 hours ending 03/10/15 0757  Inpatient Medications:  . aspirin  81 mg Oral Daily  . metoprolol tartrate  25 mg Oral BID  . multivitamin with minerals  1 tablet Oral Daily  . sodium chloride flush  3 mL Intravenous Q12H   Infusions:  . sodium chloride    . heparin 750 Units/hr (03/09/15 2233)    Labs:  Recent Labs  03/09/15 1200 03/10/15 0324  NA 142 133*  K 4.0 3.7  CL 104 98*  CO2 28 27  GLUCOSE 127* 116*  BUN 8 12  CREATININE 0.68 0.79  CALCIUM 10.0 8.7*   Recent Labs  03/09/15 1200 03/10/15 0324  WBC 7.3 9.3  HGB 14.2 13.6  HCT 41.5 40.7  MCV 93.5 93.8  PLT 306 281    Recent Labs  03/09/15 2135 03/10/15 0324  TROPONINI <0.03 <0.03   Invalid input(s): POCBNP No results for input(s): HGBA1C in the last  72 hours.   Radiology/Studies:  Dg Chest 2 View  03/09/2015  CLINICAL DATA:  Chest pain for 2 days. EXAM: CHEST  2 VIEW COMPARISON:  09/17/2013 FINDINGS: Clips project over both breasts and in the right axillary region. The lungs appear clear. Cardiac and mediastinal contours normal. No pleural effusion identified. IMPRESSION: 1.  No active cardiopulmonary disease is radiographically apparent. Electronically Signed   By: Van Clines M.D.   On: 03/09/2015 13:11     Assessment and Plan  63F with CAD (s/p DES to LCx 2014), HTN, uncontrolled HLD (h/o liver failure on statin, has Praluent at home), h/o diverticulitis with bowel resection in 10/2013, remote breast CA admitted with intermittent right sided CP with exertion.  1. Exertional chest pain concerning for [place] with CAD & known residual disease in mid-distal LAD, prior PCI of LCx - r/o for MI. On IV heparin per pharmacy. Per notes, plan for cath today for definitive evaluation. Risks/benefits already discussed. Note not tachycardic, tachypneic or hypoxic either.  2. Essential HTN - controlled on BB.  3. Hyperlipidemia -  no statin given h/o liver failure on statin, plan to start PCSK9 inhibitor as outpatient, already approved by insurance.  4. Hyperglycemia - will need further monitoring by PCP. Note prior A1C in 2015 was 6.5.  Signed, Melina Copa PA-C Pager: [phone]   Personally seen and examined. Agree with above. Await cath Known CAD. Circ PCI Exertional anginal symptoms. Trop neg.   Candee Furbish, MD

## 2015-03-10 NOTE — Interval H&P Note (Signed)
Cath Lab Visit (complete for each Cath Lab visit)  Clinical Evaluation Leading to the Procedure:   ACS: No.  Non-ACS:    Anginal Classification: CCS IV  Anti-ischemic medical therapy: Maximal Therapy (2 or more classes of medications)  Non-Invasive Test Results: No non-invasive testing performed  Prior CABG: No previous CABG      History and Physical Interval Note:  03/10/2015 2:03 PM  Ashley Conley  has presented today for surgery, with the diagnosis of cp  The various methods of treatment have been discussed with the patient and family. After consideration of risks, benefits and other options for treatment, the patient has consented to  Procedure(s): Left Heart Cath and Coronary Angiography (N/A) as a surgical intervention .  The patient's history has been reviewed, patient examined, no change in status, stable for surgery.  I have reviewed the patient's chart and labs.  Questions were answered to the patient's satisfaction.     Ashley Conley A

## 2015-03-10 NOTE — H&P (View-Only) (Signed)
  Patient: Ashley Conley / Admit Date: 03/09/2015 / Date of Encounter: 03/10/2015, 7:57 AM   Subjective: Had slight CP overnight, now CP free. EKG nonacute. No SOB.   Objective: Telemetry: NSR (nocturnal SB briefly 49s) Physical Exam: Blood pressure 115/73, pulse 64, temperature 97.9 F (36.6 C), temperature source Oral, resp. rate 18, height 5\' 2"  (1.575 m), weight 152 lb 11.2 oz (69.264 kg), SpO2 96 %. General: Well developed, well nourished WF in no acute distress. Lying flat in bed without acute SOB. Head: Normocephalic, atraumatic, sclera non-icteric, no xanthomas, nares are without discharge. Neck: Negative for carotid bruits. JVP not elevated. Lungs: Clear bilaterally to auscultation without wheezes, rales, or rhonchi. Breathing is unlabored. Heart: RRR S1 S2 without murmurs, rubs, or gallops.  Abdomen: Soft, non-tender, non-distended with normoactive bowel sounds. No rebound/guarding. Extremities: No clubbing or cyanosis. No edema. Distal pedal pulses are 2+ and equal bilaterally. Neuro: Alert and oriented X 3. Moves all extremities spontaneously. Psych:  Responds to questions appropriately with a normal affect.  No intake or output data in the 24 hours ending 03/10/15 0757  Inpatient Medications:  . aspirin  81 mg Oral Daily  . metoprolol tartrate  25 mg Oral BID  . multivitamin with minerals  1 tablet Oral Daily  . sodium chloride flush  3 mL Intravenous Q12H   Infusions:  . sodium chloride    . heparin 750 Units/hr (03/09/15 2233)    Labs:  Recent Labs  03/09/15 1200 03/10/15 0324  NA 142 133*  K 4.0 3.7  CL 104 98*  CO2 28 27  GLUCOSE 127* 116*  BUN 8 12  CREATININE 0.68 0.79  CALCIUM 10.0 8.7*   Recent Labs  03/09/15 1200 03/10/15 0324  WBC 7.3 9.3  HGB 14.2 13.6  HCT 41.5 40.7  MCV 93.5 93.8  PLT 306 281    Recent Labs  03/09/15 2135 03/10/15 0324  TROPONINI <0.03 <0.03   Invalid input(s): POCBNP No results for input(s): HGBA1C in the last  72 hours.   Radiology/Studies:  Dg Chest 2 View  03/09/2015  CLINICAL DATA:  Chest pain for 2 days. EXAM: CHEST  2 VIEW COMPARISON:  09/17/2013 FINDINGS: Clips project over both breasts and in the right axillary region. The lungs appear clear. Cardiac and mediastinal contours normal. No pleural effusion identified. IMPRESSION: 1.  No active cardiopulmonary disease is radiographically apparent. Electronically Signed   By: Van Clines M.D.   On: 03/09/2015 13:11     Assessment and Plan  45F with CAD (s/p DES to LCx 2014), HTN, uncontrolled HLD (h/o liver failure on statin, has Praluent at home), h/o diverticulitis with bowel resection in 10/2013, remote breast CA admitted with intermittent right sided CP with exertion.  1. Exertional chest pain concerning for [place] with CAD & known residual disease in mid-distal LAD, prior PCI of LCx - r/o for MI. On IV heparin per pharmacy. Per notes, plan for cath today for definitive evaluation. Risks/benefits already discussed. Note not tachycardic, tachypneic or hypoxic either.  2. Essential HTN - controlled on BB.  3. Hyperlipidemia -  no statin given h/o liver failure on statin, plan to start PCSK9 inhibitor as outpatient, already approved by insurance.  4. Hyperglycemia - will need further monitoring by PCP. Note prior A1C in 2015 was 6.5.  Signed, Melina Copa PA-C Pager: [phone]   Personally seen and examined. Agree with above. Await cath Known CAD. Circ PCI Exertional anginal symptoms. Trop neg.   Candee Furbish, MD

## 2015-03-10 NOTE — Progress Notes (Signed)
CRITICAL VALUE ALERT  Critical value received: Troponin=0.57  Date of notification:  03/10/15  Time of notification:  20:30  Critical value read back:Yes.    Nurse who received alert:  Vita Erm RN  MD notified (1st page):  Dr. Susy Manor  Time of first page:  20:30  MD notified (2nd page):  Time of second page:  Responding MD:  Dr. Susy Manor  Time MD responded:  20:45

## 2015-03-10 NOTE — Progress Notes (Signed)
Julian for Heparin Indication: chest pain/ACS  Allergies  Allergen Reactions  . Statins Other (See Comments)    Liver failure from previously taking  . Sulfa Antibiotics Nausea And Vomiting  . Dairy Aid [Lactase] Other (See Comments)    Lactose intolerant   . Advil [Ibuprofen] Itching    Patient Measurements: Height: 5\' 2"  (157.5 cm) Weight: 152 lb 11.2 oz (69.264 kg) IBW/kg (Calculated) : 50.1 Heparin Dosing Weight: 64 kg  Vital Signs: Temp: 98.2 F (36.8 C) (01/24 2021) Temp Source: Oral (01/24 2021) BP: 132/86 mmHg (01/24 2021) Pulse Rate: 62 (01/24 2021)  Labs:  Recent Labs  03/09/15 1200 03/09/15 2135 03/10/15 0324 03/10/15 0500  HGB 14.2  --  13.6  --   HCT 41.5  --  40.7  --   PLT 306  --  281  --   LABPROT  --   --  14.8  --   INR  --   --  1.14  --   HEPARINUNFRC  --   --   --  0.32  CREATININE 0.68  --  0.79  --   TROPONINI  --  <0.03 <0.03  --     Estimated Creatinine Clearance: 64.8 mL/min (by C-G formula based on Cr of 0.79).  Assessment: 65 yo female with chest pain for heparin Goal of Therapy:  Heparin level 0.3-0.7 units/ml Monitor platelets by anticoagulation protocol: Yes   Plan: Continue Heparin at current rate  F/U after cath  Phillis Knack, PharmD, BCPS  03/10/2015,4:39 AM

## 2015-03-11 ENCOUNTER — Encounter (HOSPITAL_COMMUNITY): Payer: Self-pay | Admitting: Cardiovascular Disease

## 2015-03-11 LAB — CBC
HCT: 38.1 % (ref 36.0–46.0)
Hemoglobin: 12.6 g/dL (ref 12.0–15.0)
MCH: 30.8 pg (ref 26.0–34.0)
MCHC: 33.1 g/dL (ref 30.0–36.0)
MCV: 93.2 fL (ref 78.0–100.0)
PLATELETS: 264 10*3/uL (ref 150–400)
RBC: 4.09 MIL/uL (ref 3.87–5.11)
RDW: 12.6 % (ref 11.5–15.5)
WBC: 9.3 10*3/uL (ref 4.0–10.5)

## 2015-03-11 LAB — BASIC METABOLIC PANEL
Anion gap: 11 (ref 5–15)
BUN: 8 mg/dL (ref 6–20)
CALCIUM: 8.8 mg/dL — AB (ref 8.9–10.3)
CO2: 24 mmol/L (ref 22–32)
CREATININE: 0.65 mg/dL (ref 0.44–1.00)
Chloride: 105 mmol/L (ref 101–111)
Glucose, Bld: 125 mg/dL — ABNORMAL HIGH (ref 65–99)
Potassium: 4 mmol/L (ref 3.5–5.1)
SODIUM: 140 mmol/L (ref 135–145)

## 2015-03-11 MED ORDER — ISOSORBIDE MONONITRATE ER 30 MG PO TB24
15.0000 mg | ORAL_TABLET | Freq: Every day | ORAL | Status: DC
Start: 2015-03-11 — End: 2016-04-27

## 2015-03-11 MED ORDER — TICAGRELOR 90 MG PO TABS: 90.0000 mg | ORAL_TABLET | Freq: Two times a day (BID) | ORAL | Status: DC

## 2015-03-11 MED ORDER — ISOSORBIDE MONONITRATE ER 30 MG PO TB24
15.0000 mg | ORAL_TABLET | Freq: Every day | ORAL | Status: DC
  Administered 2015-03-11: 15 mg via ORAL
  Filled 2015-03-11: qty 1

## 2015-03-11 NOTE — Progress Notes (Signed)
[phone] Pt too nauseated to walk right now. RN to walk later. Education completed with pt and daughter who voiced understanding. Has brilinta card. To ask husband if he has stent card. Pt is very active and has been following heart healthy diet. Agreed to try CRP2 in [place] so will refer. Graylon Good RN BSN 03/11/2015 8:55 AM

## 2015-03-11 NOTE — Progress Notes (Signed)
TR BAND REMOVAL  LOCATION:    right radial  DEFLATED PER PROTOCOL:    Yes.    TIME BAND OFF / DRESSING APPLIED:    00.30   SITE UPON ARRIVAL:    Level 0  SITE AFTER BAND REMOVAL:    Level 0  CIRCULATION SENSATION AND MOVEMENT:    Within Normal Limits   Yes.    COMMENTS:

## 2015-03-11 NOTE — Discharge Summary (Signed)
Discharge Summary    Patient ID: Ashley Conley,  MRN: [id number], DOB/AGE: 10/26/1950 65 y.o.  Admit date: 03/09/2015 Discharge date: 03/11/2015  Primary Care Provider: Imagene Riches Primary Cardiologist: Dr. Johnsie Cancel  Discharge Diagnoses    Principal Problem:   Unstable angina [facility]) Active Problems:   Essential hypertension   Hyperlipidemia   CAD (coronary artery disease), native coronary artery   Hyperglycemia   Allergies Allergies  Allergen Reactions  . Statins Other (See Comments)    Liver failure from previously taking  . Sulfa Antibiotics Nausea And Vomiting  . Dairy Aid [Lactase] Other (See Comments)    Lactose intolerant   . Advil [Ibuprofen] Itching    Diagnostic Studies/Procedures    1) Cardiac catheterization this admission, please see full report and below for summary. _____________   History of Present Illness / Hospital Course    Ashley Conley is a 65 y/o F with history of CAD (s/p DES to LCx 12/2012), HTN, uncontrolled HLD (has Praluent at home), hyperglycemia, remote breast CA, h/o diverticulitis with bowel resection in 10/2013 who presented to [facility] 03/09/2015 with intermittent right sided CP with exertion. She initially underwent cardiac catheterization on 01/12/2013 which showed 99% mid left circumflex treated with DES, small-caliber ostial 90% OM 2 jailed by the stent w/o EKG change or symptom, 80% mid to distal LAD treated medically. She had a negative Myoview in January 2015. She had liver failure on statin and was placed on Zetia. She returned in September 2015 with diverticulitis and underwent bowel resection. Brilinta stopped without further complication. She has been doing well up until recently when she developed occasional chest discomfort during exertion occurring on a weekly basis, mostly right sided, relieved with rest. She also noted some associated nausea. Due to persistent symptoms, she presented to [facility] where initial troponin was negative. EKG showed no  significant ST-T wave changes. She r/o for MI. She was admitted and placed on IV heparin for concern for [place]. Cardiac cath was recommended for definitive evaluation. This was performed 03/10/15 with 30% pRCA, 40% m-dRCA, 85% dLAD1, 100% dLAD2, normal LVEF. She had difficult but ultimately successful percutaneous coronary intervention to the 85% LAD stenosis which was complicated by subintimal dissection resulting in no flow phenomena distally, chest pain, and transient ST segment elevation.Ultimately after numerous balloon inflations, a 2.538 mm Xience Alpine DES stent was inserted in the mid LAD distally and PTCA alone was performed in the apical segment which resulted in restoration of TIMI-3 flow and residual narrowing of 0% at all sites. She was treated with IV NTG overnight. Post cath troponin was checked and was 0.57. She had some nausea this AM and was transitioned off IV NTG and onto low dose Imdur. DAPT was recommended, indefinitely if possible. She was given 30 day Brilinta rx and regular refills. She has had no further CP this AM. Dr. Marlou Porch has seen and examined the patient today and feels she is stable for discharge.  Of note lipid panel this AM showed continued dyslipidemia with LDL 158 - she was encouraged to start Praluent as previously instructed (had been hesitant to start). She was also advised to f/u PCP for hyperglycemia given mildly elevated blood sugars during admission - note prior A1C in 2015 was 6.5. _____________  Discharge Vitals Blood pressure 139/83, pulse 72, temperature 98 F (36.7 C), temperature source Oral, resp. rate 15, height 5\' 2"  (1.575 m), weight 152 lb 8.9 oz (69.2 kg), SpO2 94 %.  Filed Weights   03/09/15 2021  03/10/15 0440 03/11/15 0400  Weight: 152 lb 11.2 oz (69.264 kg) 152 lb 11.2 oz (69.264 kg) 152 lb 8.9 oz (69.2 kg)    Labs & Radiologic Studies     CBC  Recent Labs  03/10/15 0324 03/11/15 0258  WBC 9.3 9.3  HGB 13.6 12.6  HCT 40.7 38.1  MCV  93.8 93.2  PLT 281 [id number]   Basic Metabolic Panel  Recent Labs  03/10/15 0324 03/11/15 0258  NA 133* 140  K 3.7 4.0  CL 98* 105  CO2 27 24  GLUCOSE 116* 125*  BUN 12 8  CREATININE 0.79 0.65  CALCIUM 8.7* 8.8*   Cardiac Enzymes  Recent Labs  03/10/15 0324 03/10/15 1109 03/10/15 1858  TROPONINI <0.03 <0.03 0.57*   Fasting Lipid Panel  Recent Labs  03/10/15 0324  CHOL 252*  HDL 41  LDLCALC 158*  TRIG 265*  CHOLHDL 6.1   Dg Chest 2 View  03/09/2015  CLINICAL DATA:  Chest pain for 2 days. EXAM: CHEST  2 VIEW COMPARISON:  09/17/2013 FINDINGS: Clips project over both breasts and in the right axillary region. The lungs appear clear. Cardiac and mediastinal contours normal. No pleural effusion identified. IMPRESSION: 1.  No active cardiopulmonary disease is radiographically apparent. Electronically Signed   By: Van Clines M.D.   On: 03/09/2015 13:11    Disposition   Pt is being discharged home today in good condition.  Follow-up Plans & Appointments    Follow-up Information    Follow up with Charlie Pitter, PA-C.   Specialties:  Cardiology, Radiology   Why:  [facility] - 03/18/15 at 9:30am. Lisbeth Renshaw is one of Dr. Kyla Balzarine PAs.   Contact information:   [address] [place] [address] [phone]       Follow up with Imagene Riches, NP.   Why:  Your blood sugar was slightly elevated in the hospital. Please follow up with your PCP to discuss monitoring for pre-diabetes/diabetes.   Contact information:   [place] [address] [phone]      Discharge Instructions    Amb Referral to Cardiac Rehabilitation    Complete by:  As directed   Diagnosis:  PCI Comment - referring to [place]     Diet - low sodium heart healthy    Complete by:  As directed      Increase activity slowly    Complete by:  As directed   No driving for 2 days. No lifting over 5 lbs for 1 week. No sexual activity for 1 week. You may return to work  after your follow-up visit next week if you are feeling well (if applicable). Keep procedure site clean & dry. If you notice increased pain, swelling, bleeding or pus, call/return!  You may shower, but no soaking baths/hot tubs/pools for 1 week.   You were started on 2 new medicines: Brilinta and isosorbide mononitrate.           Discharge Medications   Current Discharge Medication List    START taking these medications   Details  isosorbide mononitrate (IMDUR) 30 MG 24 hr tablet Take 0.5 tablets (15 mg total) by mouth daily. Qty: 30 tablet, Refills: 6    ticagrelor (BRILINTA) 90 MG TABS tablet Take 1 tablet (90 mg total) by mouth 2 (two) times daily. Qty: 180 tablet, Refills: 3      CONTINUE these medications which have NOT CHANGED   Details  aspirin 81 MG chewable tablet Chew  81 mg by mouth daily.     Coenzyme Q10 (COQ-10) 200 MG CAPS Take 200 mg by mouth every evening.     metoprolol tartrate (LOPRESSOR) 25 MG tablet Take 25 mg by mouth 2 (two) times daily.    Multiple Vitamin (MULTIVITAMIN) tablet Take 2 tablets by mouth daily.    nitroGLYCERIN (NITROSTAT) 0.4 MG SL tablet Place 0.4 mg under the tongue every 5 (five) minutes as needed for chest pain.    RA KRILL OIL 500 MG CAPS Take 500 mg by mouth daily.    Alirocumab (PRALUENT) 75 MG/ML SOPN Inject 1 pen into the skin every 14 (fourteen) days.     ZETIA 10 MG tablet TAKE ONE TABLET BY MOUTH ONCE DAILY          Outstanding Labs/Studies   N/A  Duration of Discharge Encounter   Greater than 30 minutes including physician time.  Signed, Nedra Hai Dunn PA-C 03/11/2015, 9:22 AM     [phone] with CAD (s/p DES to LCx 2014), HTN, uncontrolled HLD (h/o liver failure on statin, has Praluent at home), h/o diverticulitis with bowel resection in 10/2013, remote breast CA admitted with intermittent right sided CP with exertion. CATH with LAD PCI.  1. Exertional chest pain concerning for [place] with CAD & known residual disease  in mid-distal LAD, prior PCI of LCx - r/o for MI. Cath as above, mid to distal LAD, long DES. No reflow resolved. DAPT probably indefinitely. Mild nausea this AM. Turn NTG off. Home today. Will add Imdur 15mg  PO QD.   2. Essential HTN - controlled on BB.  3. Hyperlipidemia - no statin given h/o liver failure on statin, plan to start PCSK9 inhibitor as outpatient, already approved by insurance.  4. Hyperglycemia - will need further monitoring by PCP. Note prior A1C in 2015 was 6.5.  Follow up Dr. Madelon Lips, MD

## 2015-03-11 NOTE — Progress Notes (Signed)
Patient: Ashley Conley / Admit Date: 03/09/2015 / Date of Encounter: 03/11/2015, 8:38 AM   Subjective: Mild nausea. No CP. NTG since cath. Minimal trop 0.5   Objective: Telemetry: NSR (nocturnal SB briefly 49s) Physical Exam: Blood pressure 126/81, pulse 59, temperature 98.8 F (37.1 C), temperature source Oral, resp. rate 16, height 5\' 2"  (1.575 m), weight 152 lb 8.9 oz (69.2 kg), SpO2 94 %. General: Well developed, well nourished WF in no acute distress. Lying flat in bed without acute SOB. Head: Normocephalic, atraumatic, sclera non-icteric, no xanthomas, nares are without discharge. Neck: Negative for carotid bruits. JVP not elevated. Lungs: Clear bilaterally to auscultation without wheezes, rales, or rhonchi. Breathing is unlabored. Heart: RRR S1 S2 without murmurs, rubs, or gallops.  Abdomen: Soft, non-tender, non-distended with normoactive bowel sounds. No rebound/guarding. Extremities: No clubbing or cyanosis. No edema. Distal pedal pulses are 2+ and equal bilaterally. Wrist normal post cath Neuro: Alert and oriented X 3. Moves all extremities spontaneously. Psych:  Responds to questions appropriately with a normal affect.   Intake/Output Summary (Last 24 hours) at 03/11/15 0838 Last data filed at 03/11/15 0700  Gross per 24 hour  Intake 1429.23 ml  Output      0 ml  Net 1429.23 ml    Inpatient Medications:  . aspirin  81 mg Oral Daily  . metoprolol tartrate  25 mg Oral BID  . multivitamin with minerals  1 tablet Oral Daily  . sodium chloride flush  3 mL Intravenous Q12H  . ticagrelor  90 mg Oral BID   Infusions:  . sodium chloride 10 mL/hr at 03/11/15 0700  . nitroGLYCERIN 10 mcg/min (03/11/15 0700)    Labs:  Recent Labs  03/10/15 0324 03/11/15 0258  NA 133* 140  K 3.7 4.0  CL 98* 105  CO2 27 24  GLUCOSE 116* 125*  BUN 12 8  CREATININE 0.79 0.65  CALCIUM 8.7* 8.8*    Recent Labs  03/10/15 0324 03/11/15 0258  WBC 9.3 9.3  HGB 13.6 12.6  HCT 40.7  38.1  MCV 93.8 93.2  PLT 281 264    Recent Labs  03/09/15 2135 03/10/15 0324 03/10/15 1109 03/10/15 1858  TROPONINI <0.03 <0.03 <0.03 0.57*   Invalid input(s): POCBNP No results for input(s): HGBA1C in the last 72 hours.   Radiology/Studies:  Dg Chest 2 View  03/09/2015  CLINICAL DATA:  Chest pain for 2 days. EXAM: CHEST  2 VIEW COMPARISON:  09/17/2013 FINDINGS: Clips project over both breasts and in the right axillary region. The lungs appear clear. Cardiac and mediastinal contours normal. No pleural effusion identified. IMPRESSION: 1.  No active cardiopulmonary disease is radiographically apparent. Electronically Signed   By: Van Clines M.D.   On: 03/09/2015 13:11    CATH:  Prox RCA lesion, 30% stenosed.  Mid RCA to Dist RCA lesion, 40% stenosed.  Dist LAD-1 lesion, 85% stenosed. Post intervention, there is a 0% residual stenosis. The lesion was not previously treated.  Dist LAD-2 lesion, 100% stenosed. Post intervention, there is a 0% residual stenosis.  The left ventricular systolic function is normal.  Normal LV function.  Progressive native coronary obstructive disease with eccentric 85% somewhat calcified mid LAD stenosis; widely patent stent in the left circumflex coronary artery; and a dominant RCA with smooth 30% proximal and 30-40% narrowing in the region of the acute margin.  Difficult but ultimately successful percutaneous coronary intervention to the 85% mid LAD stenosis which was complicated by subintimal dissection resulting in no flow  phenomena distally, chest pain, and transient ST segment elevation. Ultimately after numerous balloon inflations, a 2.538 mm Xience Alpine DES stent was inserted in the mid LAD distally and PTCA alone was performed in the apical segment which resulted in restoration of TIMI-3 flow and residual narrowing of 0% at all sites.  RECOMMENDATION: Patient should continue dual antiplatelet therapy, probably indefinitely.     Assessment and Plan  65F with CAD (s/p DES to LCx 2014), HTN, uncontrolled HLD (h/o liver failure on statin, has Praluent at home), h/o diverticulitis with bowel resection in 10/2013, remote breast CA admitted with intermittent right sided CP with exertion. CATH with LAD PCI.  1. Exertional chest pain concerning for [place] with CAD & known residual disease in mid-distal LAD, prior PCI of LCx - r/o for MI. Cath as above, mid to distal LAD, long DES. No reflow resolved. DAPT probably indefinitely. Mild nausea this AM. Turn NTG off. Home today. Will add Imdur 15mg  PO QD.   2. Essential HTN - controlled on BB.  3. Hyperlipidemia -  no statin given h/o liver failure on statin, plan to start PCSK9 inhibitor as outpatient, already approved by insurance.  4. Hyperglycemia - will need further monitoring by PCP. Note prior A1C in 2015 was 6.5.  Follow up Dr. Madelon Lips, MD

## 2015-03-12 ENCOUNTER — Telehealth: Payer: Self-pay | Admitting: Pharmacist

## 2015-03-12 DIAGNOSIS — E785 Hyperlipidemia, unspecified: Secondary | ICD-10-CM

## 2015-03-12 NOTE — Telephone Encounter (Signed)
Called pt to follow up with Praluent. Pt reports that she received her first shipment last Friday. She is planning to give her first injection on Monday 1/30. Scheduled lab work for mid March.

## 2015-03-16 NOTE — Progress Notes (Signed)
Cardiology Office Note Date:  03/18/2015  Patient ID:  Ashley Conley, DOB [birth year], MRN [id number] PCP:  Imagene Riches, NP  Cardiologist:  Dr. Johnsie Cancel   Chief Complaint: f/u stenting  History of Present Illness: Ashley Conley is a 65 y.o. female with history of CAD (s/p DES to LCx 12/2012, [place] 02/2015 s/p difficult PCI with DES to mLAD and PTCA alone to apical segment), HTN, uncontrolled HLD (has Praluent at home), hyperglycemia, remote breast CA, h/o diverticulitis with bowel resection in 10/2013 who presents for post-hospital f/u.   Per review of history, she initially underwent cardiac catheterization on 01/12/2013 which showed 99% mid left circumflex treated with DES, small-caliber ostial 90% OM 2 jailed by the stent w/o EKG change or symptom, 80% mid to distal LAD treated medically. She had a negative Myoview in January 2015. She had liver failure on statin and was placed on Zetia. She was admitted September 2015 with diverticulitis and underwent bowel resection. Brilinta was stopped at that time. She has been set up for Praluent by our lipid clinic. She has been doing well up until recently when she developed occasional chest discomfort during exertion occurring on a weekly basis, mostly right sided, relieved with rest. She presented to the hospital 02/2015 where she r/o for MI. She underwent LHC 03/10/15 showing 30% pRCA, 40% m-dRCA, 85% dLAD1, 100% dLAD2, normal LVEF. She had difficult but ultimately successful percutaneous coronary intervention to the 85% LAD stenosis which was complicated by subintimal dissection resulting in no flow phenomena distally, chest pain, and transient ST segment elevation.Ultimately after numerous balloon inflations, a 2.538 mm Xience Alpine DES stent was inserted in the mid LAD distally and PTCA alone was performed in the apical segment which resulted in restoration of TIMI-3 flow and residual narrowing of 0% at all sites. LVEF 55-65%. Post cath troponin was checked and  was 0.57. She was transitioned from IV NTG to Imdur. DAPT was recommended, indefinitely if possible.   She returns for follow-up feeling great. No further chest pain. No SOB, palpitations, syncope or bleeding reported. She has given herself her first shot of Praluent after discharge. She plans to participate in cardiac rehab.   Past Medical History  Diagnosis Date  . Hypertension   . Hyperlipidemia     a. Statins caused to elevated LFTs.  Marland Kitchen CAD (coronary artery disease)     a. a. S/p DES to LCX 12/2012. b. 02/2015: s/p successful but difficult PTCA to dLAD-1 c/b subintimal dissection requiring balloon inflations/DES, and PTCA to dLAD-2.  . NSTEMI (non-ST elevated myocardial infarction) ([place]) 12/2012  . Cancer of right breast ([place]) 1992  . Hyperglycemia   . Diverticulitis     a. bowel resection in 10/2013.    Past Surgical History  Procedure Laterality Date  . Reconstruction breast w/ tram flap Bilateral 1992  . Liver biopsy  2000s    Performed for abnormal LFTs in the setting of statin use  . Proctoscopy N/A 10/27/2013    Procedure: RIGID PROCTOSCOPY;  Surgeon: Leighton Ruff, MD;  Location: [facility] ORS;  Service: General;  Laterality: N/A;  . Left heart cath N/A 01/12/2013    Procedure: LEFT HEART CATH;  Surgeon: Wellington Hampshire, MD;  Location: [facility] CATH LAB;  Service: Cardiovascular;  Laterality: N/A;  . Percutaneous coronary stent intervention (pci-s)  01/12/2013    Procedure: PERCUTANEOUS CORONARY STENT INTERVENTION (PCI-S);  Surgeon: Wellington Hampshire, MD;  Location: [facility] CATH LAB;  Service: Cardiovascular;;  Mid CX DES  . Colectomy  2015    "for diverticulitis"  . Tonsillectomy  1950s  . Mastectomy Bilateral 1992  . Total abdominal hysterectomy  2005  . Coronary angioplasty with stent placement  01/12/2013  . Breast biopsy Right 1990  . Breast reconstruction Right 1990    w/implant  . Cardiac catheterization N/A 03/10/2015    Procedure: Left Heart Cath and Coronary Angiography;   Surgeon: Troy Sine, MD;  Location: [facility] CV LAB;  Service: Cardiovascular;  Laterality: N/A;    Current Outpatient Prescriptions  Medication Sig Dispense Refill  . Alirocumab (PRALUENT) 75 MG/ML SOPN Inject 1 pen into the skin every 14 (fourteen) days. 2 pen 11  . aspirin 81 MG chewable tablet Chew 81 mg by mouth daily.     . Coenzyme Q10 (COQ-10) 200 MG CAPS Take 200 mg by mouth every evening.     . isosorbide mononitrate (IMDUR) 30 MG 24 hr tablet Take 0.5 tablets (15 mg total) by mouth daily. 30 tablet 6  . metoprolol tartrate (LOPRESSOR) 25 MG tablet Take 25 mg by mouth 2 (two) times daily.    . Multiple Vitamin (MULTIVITAMIN) tablet Take 2 tablets by mouth daily.    . nitroGLYCERIN (NITROSTAT) 0.4 MG SL tablet Place 0.4 mg under the tongue every 5 (five) minutes as needed for chest pain.    Marland Kitchen RA KRILL OIL 500 MG CAPS Take 500 mg by mouth daily.    . ticagrelor (BRILINTA) 90 MG TABS tablet Take 1 tablet (90 mg total) by mouth 2 (two) times daily. 180 tablet 3   No current facility-administered medications for this visit.    Allergies:   Statins; Sulfa antibiotics; Dairy aid; and Advil   Social History:  The patient  reports that she has never smoked. She has never used smokeless tobacco. She reports that she does not drink alcohol or use illicit drugs.   Family History:  The patient's family history includes Healthy in her mother; Heart attack in her father, maternal grandfather, and paternal grandmother; Heart disease in her father. There is no history of Hypertension or Stroke.  ROS:  Please see the history of present illness.  All other systems are reviewed and otherwise negative.   PHYSICAL EXAM:  VS:  BP 110/70 mmHg  Pulse 88  Ht 5\' 2"  (1.575 m)  Wt 153 lb (69.4 kg)  BMI 27.98 kg/m2 BMI: Body mass index is 27.98 kg/(m^2). Well nourished, well developed WF in no acute distress HEENT: normocephalic, atraumatic Neck: no JVD, carotid bruits or masses Cardiac:   normal S1, S2; RRR; no murmurs, rubs, or gallops Lungs:  clear to auscultation bilaterally, no wheezing, rhonchi or rales Abd: soft, nontender, no hepatomegaly, + BS MS: no deformity or atrophy Ext: no edema. Right radial cath site without hematoma or ecchymosis; good pulse. Skin: warm and dry, no rash Neuro:  moves all extremities spontaneously, no focal abnormalities noted, follows commands Psych: euthymic mood, full affect  EKG:  Done today shows NSR 75bpm, prior inferior MI as well as prior anterolateral MI, nonspecific ST-T changes.  Recent Labs: 12/24/2014: ALT 76* 03/11/2015: BUN 8; Creatinine, Ser 0.65; Hemoglobin 12.6; Platelets 264; Potassium 4.0; Sodium 140  03/10/2015: Cholesterol 252*; HDL 41; LDL Cholesterol 158*; Total CHOL/HDL Ratio 6.1; Triglycerides 265*; VLDL 53*   Estimated Creatinine Clearance: 64.8 mL/min (by C-G formula based on Cr of 0.65).   Wt Readings from Last 3 Encounters:  03/18/15 153 lb (69.4 kg)  03/11/15 152 lb 8.9 oz (69.2 kg)  12/22/14 150  lb 6.4 oz (68.221 kg)     Other studies reviewed: Additional studies/records reviewed today include: summarized above  ASSESSMENT AND PLAN:  1. CAD s/p PCI as above - doing well post-PCI. Continue ASA, BB, Praluent, Brilinta. She reported that her Brilinta bottle did not have any refills although this was sent in for 180 tabs with 3 refills at discharge. We called [facility] to confirm they did in fact receive the rx with refills - the only reason her initial bottle says 0 refills is because this was the separate 30-day free RX. She was made aware that refills are available at her pharmacy. She requests refill on her NTG today due to it being out of date. She plans to participate in cardiac rehab and I congratulated her on this decision. 2. Essential HTN - controlled. 3. Hyperlipidemia - continue Praluent per lipid clinic. 4. Hyperglycemia - I asked her to f/u PCP for further monitoring. 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  Disposition: F/u with Dr. Johnsie Cancel in 4 months.  Current medicines are reviewed at length with the patient today.  The patient did not have any concerns regarding medicines.  Raechel Ache PA-C 03/18/2015 10:00 AM     [place] [place] [address] [phone] (office)  [phone] (fax)

## 2015-03-18 ENCOUNTER — Ambulatory Visit (INDEPENDENT_AMBULATORY_CARE_PROVIDER_SITE_OTHER): Payer: BLUE CROSS/BLUE SHIELD | Admitting: Physician Assistant

## 2015-03-18 ENCOUNTER — Encounter: Payer: Self-pay | Admitting: Physician Assistant

## 2015-03-18 VITALS — BP 110/70 | HR 88 | Ht 62.0 in | Wt 153.0 lb

## 2015-03-18 DIAGNOSIS — R739 Hyperglycemia, unspecified: Secondary | ICD-10-CM

## 2015-03-18 DIAGNOSIS — I10 Essential (primary) hypertension: Secondary | ICD-10-CM | POA: Diagnosis not present

## 2015-03-18 DIAGNOSIS — E785 Hyperlipidemia, unspecified: Secondary | ICD-10-CM

## 2015-03-18 DIAGNOSIS — I2510 Atherosclerotic heart disease of native coronary artery without angina pectoris: Secondary | ICD-10-CM

## 2015-03-18 DIAGNOSIS — Z9861 Coronary angioplasty status: Secondary | ICD-10-CM

## 2015-03-18 MED ORDER — NITROGLYCERIN 0.4 MG SL SUBL: 0.4000 mg | SUBLINGUAL_TABLET | SUBLINGUAL | Status: DC | PRN

## 2015-03-18 NOTE — Patient Instructions (Signed)
Medication Instructions:  None  Labwork: None  Testing/Procedures: None  Follow-Up: Your physician recommends that you schedule a follow-up appointment in: 4 months with Dr. Johnsie Cancel.   Any Other Special Instructions Will Be Listed Below (If Applicable).     If you need a refill on your cardiac medications before your next appointment, please call your pharmacy.

## 2015-03-31 ENCOUNTER — Telehealth: Payer: Self-pay | Admitting: Pharmacist

## 2015-03-31 NOTE — Telephone Encounter (Signed)
Pt called to inform us of difficulties with using the Praluent pre-filled pen.  She stated she did her first injection 2-3 weeks ago and it took 2 people to engage the syringe.  Once it was injected, she stated it was one of the most painful injections she had done.  She did the injection on her leg.  This past week she tried to do the second injection on her stomach by herself.  She states she watched the video multiple times and was still unable to do the injection.  I offered for the patient to come to the office for assistance or we could change her to the pre-filled syringe for easier administration.  She stated she is changing insurance to [facility] in April and will not be able to afford therapy at that time so she would just stay off the medication.  She is interested in potential clinical trials so we will place her name on our screening list for CLEAR.

## 2015-03-31 NOTE — Telephone Encounter (Signed)
New message       Talk to Ashley Conley.  Pt does not want to take the praluent

## 2015-04-14 ENCOUNTER — Other Ambulatory Visit: Payer: Self-pay | Admitting: Cardiovascular Disease

## 2015-04-26 ENCOUNTER — Other Ambulatory Visit: Payer: Self-pay

## 2015-07-16 NOTE — Progress Notes (Signed)
Patient ID: Ashley Conley, female   DOB: 1950/11/19, 65 y.o.   MRN: [id number]    Cardiology Office Note Date:  07/19/2015  Patient ID:  Ashley Conley, DOB April 29, 1950, MRN [id number] PCP:  Imagene Riches, NP  Cardiologist:  Dr. Johnsie Cancel   Chief Complaint: f/u stenting  History of Present Illness: Ashley Conley is a 65 y.o. female with history of CAD (s/p DES to LCx 12/2012, [place] 02/2015 s/p difficult PCI with DES to mLAD and PTCA alone to apical segment), HTN, uncontrolled HLD (has Praluent at home), hyperglycemia, remote breast CA, h/o diverticulitis with bowel resection in 10/2013 who presents for post-hospital f/u.   Per review of history, she initially underwent cardiac catheterization on 01/12/2013 which showed 99% mid left circumflex treated with DES, small-caliber ostial 90% OM 2 jailed by the stent w/o EKG change or symptom, 80% mid to distal LAD treated medically. She had a negative Myoview in January 2015. She had liver failure on statin and was placed on Zetia. She was admitted September 2015 with diverticulitis and underwent bowel resection. Brilinta was stopped at that time. She has been set up for Praluent by our lipid clinic. She has been doing well up until recently when she developed occasional chest discomfort during exertion occurring on a weekly basis, mostly right sided, relieved with rest. She presented to the hospital 02/2015 where she r/o for MI. She underwent LHC 03/10/15 showing 30% pRCA, 40% m-dRCA, 85% dLAD1, 100% dLAD2, normal LVEF. She had difficult but ultimately successful percutaneous coronary intervention to the 85% LAD stenosis which was complicated by subintimal dissection resulting in no flow phenomena distally, chest pain, and transient ST segment elevation.Ultimately after numerous balloon inflations, a 2.538 mm Xience Alpine DES stent was inserted in the mid LAD distally and PTCA alone was performed in the apical segment which resulted in restoration of TIMI-3 flow and residual  narrowing of 0% at all sites. LVEF 55-65%. Post cath troponin was checked and was 0.57. She was transitioned from IV NTG to Imdur. DAPT was recommended, indefinitely if possible.   She returns for follow-up feeling great. No further chest pain. No SOB, palpitations, syncope or bleeding reported. She was tried on praluent but could not negotiate the shots and they were painful and she is now on medicare and cannot afford shots  LFT;s have been up. Followed by Katheran Awe.  Last AST 47 and ALT 70  Had "scan" and ok not sure if it was MRI or CT Dyspnea with Brillinta   Past Medical History  Diagnosis Date  . Hypertension   . Hyperlipidemia     a. Statins caused to elevated LFTs.  Marland Kitchen CAD (coronary artery disease)     a. a. S/p DES to LCX 12/2012. b. 02/2015: s/p successful but difficult PTCA to dLAD-1 c/b subintimal dissection requiring balloon inflations/DES, and PTCA to dLAD-2.  . NSTEMI (non-ST elevated myocardial infarction) ([place]) 12/2012  . Cancer of right breast ([place]) 1992  . Hyperglycemia   . Diverticulitis     a. bowel resection in 10/2013.    Past Surgical History  Procedure Laterality Date  . Reconstruction breast w/ tram flap Bilateral 1992  . Liver biopsy  2000s    Performed for abnormal LFTs in the setting of statin use  . Proctoscopy N/A 10/27/2013    Procedure: RIGID PROCTOSCOPY;  Surgeon: Leighton Ruff, MD;  Location: [facility];  Service: General;  Laterality: N/A;  . Left heart cath N/A 01/12/2013    Procedure: LEFT HEART CATH;  Surgeon: Wellington Hampshire, MD;  [facility] CATH LAB;  Service: Cardiovascular;  Laterality: N/A;  . Percutaneous coronary stent intervention (pci-s)  01/12/2013    Procedure: PERCUTANEOUS CORONARY STENT INTERVENTION (PCI-S);  Surgeon: Wellington Hampshire, MD;  Location: [facility] CATH LAB;  Service: Cardiovascular;;  Mid CX DES  . Colectomy  2015    "for diverticulitis"  . Tonsillectomy  1950s  . Mastectomy Bilateral 1992  . Total abdominal hysterectomy   2005  . Coronary angioplasty with stent placement  01/12/2013  . Breast biopsy Right 1990  . Breast reconstruction Right 1990    w/implant  . Cardiac catheterization N/A 03/10/2015    Procedure: Left Heart Cath and Coronary Angiography;  Surgeon: Troy Sine, MD;  Location: [place] CV LAB;  Service: Cardiovascular;  Laterality: N/A;    Current Outpatient Prescriptions  Medication Sig Dispense Refill  . aspirin 81 MG chewable tablet Chew 81 mg by mouth daily.     . Coenzyme Q10 (COQ-10) 200 MG CAPS Take 200 mg by mouth every evening.     . isosorbide mononitrate (IMDUR) 30 MG 24 hr tablet Take 0.5 tablets (15 mg total) by mouth daily. 30 tablet 6  . metoprolol tartrate (LOPRESSOR) 25 MG tablet TAKE ONE TABLET BY MOUTH TWICE DAILY 60 tablet 4  . Multiple Vitamin (MULTIVITAMIN) tablet Take 2 tablets by mouth daily.    . nitroGLYCERIN (NITROSTAT) 0.4 MG SL tablet Place 1 tablet (0.4 mg total) under the tongue every 5 (five) minutes as needed for chest pain. 25 tablet 5  . RA KRILL OIL 500 MG CAPS Take 500 mg by mouth daily.    . ticagrelor (BRILINTA) 90 MG TABS tablet Take 1 tablet (90 mg total) by mouth 2 (two) times daily. 180 tablet 3  . ZETIA 10 MG tablet Take 10 mg by mouth daily.     No current facility-administered medications for this visit.    Allergies:   Statins; Sulfa antibiotics; Dairy aid; and Advil   Social History:  The patient  reports that she has never smoked. She has never used smokeless tobacco. She reports that she does not drink alcohol or use illicit drugs.   Family History:  The patient's family history includes Healthy in her mother; Heart attack in her father, maternal grandfather, and paternal grandmother; Heart disease in her father. There is no history of Hypertension or Stroke.  ROS:  Please see the history of present illness.  All other systems are reviewed and otherwise negative.   PHYSICAL EXAM:  VS:  BP 110/70 mmHg  Pulse 61  Ht 5\' 2"  (1.575 m)   Wt 69.128 kg (152 lb 6.4 oz)  BMI 27.87 kg/m2  SpO2 94% BMI: Body mass index is 27.87 kg/(m^2). Well nourished, well developed WF in no acute distress HEENT: normocephalic, atraumatic Neck: no JVD, carotid bruits or masses Cardiac:  normal S1, S2; RRR; no murmurs, rubs, or gallops Lungs:  clear to auscultation bilaterally, no wheezing, rhonchi or rales Abd: soft, nontender, no hepatomegaly, + BS MS: no deformity or atrophy Ext: no edema. Right radial cath site without hematoma or ecchymosis; good pulse. Skin: warm and dry, no rash Neuro:  moves all extremities spontaneously, no focal abnormalities noted, follows commands Psych: euthymic mood, full affect  EKG:  Done today shows NSR 75bpm, prior inferior MI as well as prior anterolateral MI, nonspecific ST-T changes.  Recent Labs: 12/24/2014: ALT 76* 03/11/2015: BUN 8; Creatinine, Ser 0.65; Hemoglobin 12.6; Platelets 264; Potassium 4.0; Sodium  140  03/10/2015: Cholesterol 252*; HDL 41; LDL Cholesterol 158*; Total CHOL/HDL Ratio 6.1; Triglycerides 265*; VLDL 53*   CrCl cannot be calculated (Patient has no serum creatinine result on file.).   Wt Readings from Last 3 Encounters:  07/19/15 69.128 kg (152 lb 6.4 oz)  03/18/15 69.4 kg (153 lb)  03/11/15 69.2 kg (152 lb 8.9 oz)     Other studies reviewed: Additional studies/records reviewed today include: summarized above  ASSESSMENT AND PLAN:  1. CAD s/p PCI as above - doing well post-PCI. Continue ASA, BB, ,Start Effient stop Brillinta due to dyspnea  2. Essential HTN - controlled. 3. Hyperlipidemia - intolerant to statin unable to afford Praluent on study research list for CLEAR. 4. Hyperglycemia - I asked her to f/u PCP for further monitoring. She reports h/o pre-DM. 5. Liver:  F/u Dr Kalman Shan not on cholesterol meds other than Zetia.  Told her to make sure she gets hepatitis serology  Disposition: F/u with me in a year .  Current medicines are reviewed at length with the patient  today.  The patient did not have any concerns regarding medicines.  Jenkins Rouge

## 2015-07-19 ENCOUNTER — Encounter: Payer: Self-pay | Admitting: Cardiovascular Disease

## 2015-07-19 ENCOUNTER — Ambulatory Visit (INDEPENDENT_AMBULATORY_CARE_PROVIDER_SITE_OTHER): Payer: Medicare Other | Admitting: Cardiovascular Disease

## 2015-07-19 VITALS — BP 110/70 | HR 61 | Ht 62.0 in | Wt 152.4 lb

## 2015-07-19 DIAGNOSIS — I200 Unstable angina: Secondary | ICD-10-CM

## 2015-07-19 DIAGNOSIS — I10 Essential (primary) hypertension: Secondary | ICD-10-CM

## 2015-07-19 MED ORDER — PRASUGREL HCL 10 MG PO TABS: 10.0000 mg | ORAL_TABLET | Freq: Every day | ORAL | Status: DC

## 2015-07-19 NOTE — Patient Instructions (Signed)
Medication Instructions:  Your physician has recommended you make the following change in your medication:  1-STOP Brilinta 2-START Effient 10 mg by mouth daily  Labwork: NONE  Testing/Procedures: NONE  Follow-Up: Your physician wants you to follow-up in: 6 months with Dr. Johnsie Cancel. You will receive a reminder letter in the mail two months in advance. If you don't receive a letter, please call our office to schedule the follow-up appointment.   If you need a refill on your cardiac medications before your next appointment, please call your pharmacy.

## 2015-09-19 ENCOUNTER — Other Ambulatory Visit: Payer: Self-pay | Admitting: Cardiovascular Disease

## 2015-10-26 ENCOUNTER — Encounter: Payer: Self-pay | Admitting: *Deleted

## 2015-10-26 DIAGNOSIS — Z006 Encounter for examination for normal comparison and control in clinical research program: Secondary | ICD-10-CM

## 2015-10-26 NOTE — Progress Notes (Signed)
Late entry:   Subject met inclusion and exclusion criteria. The informed consent form, study requirements and expectations were reviewed with the subject and questions and concerns were addressed prior to the signing of the consent form. The subject verbalized understanding of the trail requirements. The subject agreed to participate in the CLEAR trial and signed the informed consent. The informed consent was obtained prior to performance of any protocol-specific procedures for the subject. A copy of the signed informed consent was given to the subject and a copy was placed in the subject's medical record.        Jake Bathe, RN October 25, 2015 1105

## 2015-10-27 ENCOUNTER — Telehealth: Payer: Self-pay | Admitting: *Deleted

## 2015-10-27 NOTE — Telephone Encounter (Signed)
I called patient with lab results for Clear Study. Patient  met lab criteria except ALT is 67 U/L which is one point off criteria for study. I have asked sponsor of study for permission to repeat lab. I will check with Dr. Johnsie Cancel about Zetia dosage . I will call patient back on Monday to let her know about Clear Study.

## 2015-11-16 ENCOUNTER — Telehealth: Payer: Self-pay | Admitting: *Deleted

## 2015-11-16 NOTE — Telephone Encounter (Signed)
I spoke with patient to let her know her liver enzymes went down on repeat labs for the Clear Study. ALT went form 67 to 48 and AST went from 44 to 40. I will see patient tomorrow for her second screening visit for Clear Study.

## 2015-11-17 ENCOUNTER — Encounter: Payer: Self-pay | Admitting: *Deleted

## 2015-11-17 DIAGNOSIS — Z006 Encounter for examination for normal comparison and control in clinical research program: Secondary | ICD-10-CM

## 2015-11-17 NOTE — Progress Notes (Signed)
Late entry:   Subject met inclusion and exclusion criteria. The informed consent form, study requirements and expectations were reviewed with the subject and questions and concerns were addressed prior to the signing of the consent form. The subject verbalized understanding of the trail requirements. The subject agreed to participate in the CLEARtrial and signed the informed consent. The informed consent was obtained prior to performance of any protocol-specific procedures for the subject. A copy of the signed informed consent was given to the subject and a copy was placed in the subject's medical record.        Jake Bathe, RN November 11, 2015 [id number]

## 2015-12-01 ENCOUNTER — Encounter: Payer: Self-pay | Admitting: *Deleted

## 2015-12-01 DIAGNOSIS — Z006 Encounter for examination for normal comparison and control in clinical research program: Secondary | ICD-10-CM

## 2015-12-01 NOTE — Progress Notes (Signed)
Spoke with [name] regarding her participation in the [employer].  After conversations with the [place] Director for the trial, Dr. Blanchie Serve it was determined that due to statin induced liver failure that Ashley Conley experienced she would not be a good candidate for the study. We will keep her in our data base and if a suitable trial becomes available we will screen her for eligiblility.

## 2016-01-19 ENCOUNTER — Other Ambulatory Visit: Payer: Self-pay | Admitting: Cardiovascular Disease

## 2016-02-18 NOTE — Progress Notes (Deleted)
Patient ID: Ashley Conley, female   DOB: Jun 24, 1950, 66 y.o.   MRN: [id number]    Cardiology Office Note Date:  02/18/2016  Patient ID:  Ashley Conley, DOB [birth year], MRN [id number] PCP:  Imagene Riches, NP  Cardiologist:  Dr. Johnsie Cancel   Chief Complaint: f/u stenting  History of Present Illness: [name] is a 66 y.o. female with history of CAD (s/p DES to LCx 12/2012, [place] 02/2015 s/p difficult PCI with DES to mLAD and PTCA alone to apical segment), HTN, uncontrolled HLD (has Praluent at home), hyperglycemia, remote breast CA, h/o diverticulitis with bowel resection in 10/2013 who presents for post-hospital f/u.   Per review of history, she initially underwent cardiac catheterization on 01/12/2013 which showed 99% mid left circumflex treated with DES, small-caliber ostial 90% OM 2 jailed by the stent w/o EKG change or symptom, 80% mid to distal LAD treated medically. She had a negative Myoview in January 2015. She had liver failure on statin and was placed on Zetia. She was admitted September 2015 with diverticulitis and underwent bowel resection. Brilinta was stopped at that time. She has been set up for Praluent by our lipid clinic. She has been doing well up until recently when she developed occasional chest discomfort during exertion occurring on a weekly basis, mostly right sided, relieved with rest. She presented to the hospital 02/2015 where she r/o for MI. She underwent LHC 03/10/15 showing 30% pRCA, 40% m-dRCA, 85% dLAD1, 100% dLAD2, normal LVEF. She had difficult but ultimately successful percutaneous coronary intervention to the 85% LAD stenosis which was complicated by subintimal dissection resulting in no flow phenomena distally, chest pain, and transient ST segment elevation.Ultimately after numerous balloon inflations, a 2.538 mm Xience Alpine DES stent was inserted in the mid LAD distally and PTCA alone was performed in the apical segment which resulted in restoration of TIMI-3 flow and residual  narrowing of 0% at all sites. LVEF 55-65%. Post cath troponin was checked and was 0.57. She was transitioned from IV NTG to Imdur. DAPT was recommended, indefinitely if possible.   She returns for follow-up feeling great. No further chest pain. No SOB, palpitations, syncope or bleeding reported. She was tried on praluent but could not negotiate the shots and they were painful and she is now on medicare and cannot afford shots  LFT;s have been up. Followed by Katheran Awe.  Last AST 47 and ALT 70  Had "scan" and ok not sure if it was MRI or CT Dyspnea with Brillinta so changed to Effient last visit with improvement  Not a candidate for cholesterol CLEAR trial due to LFT abnormalities  . Lab Results  Component Value Date   LDLCALC 158 (H) 03/10/2015    Past Medical History:  Diagnosis Date  . CAD (coronary artery disease)    a. a. S/p DES to LCX 12/2012. b. 02/2015: s/p successful but difficult PTCA to dLAD-1 c/b subintimal dissection requiring balloon inflations/DES, and PTCA to dLAD-2.  Marland Kitchen Cancer of right breast ([place]) 1992  . Diverticulitis    a. bowel resection in 10/2013.  Marland Kitchen Hyperglycemia   . Hyperlipidemia    a. Statins caused to elevated LFTs.  . Hypertension   . NSTEMI (non-ST elevated myocardial infarction) ([place]) 12/2012    Past Surgical History:  Procedure Laterality Date  . BREAST BIOPSY Right 1990  . BREAST RECONSTRUCTION Right 1990   w/implant  . CARDIAC CATHETERIZATION N/A 03/10/2015   Procedure: Left Heart Cath and Coronary Angiography;  Surgeon: Troy Sine, MD;  Location: [place] CV LAB;  Service: Cardiovascular;  Laterality: N/A;  . COLECTOMY  2015   "for diverticulitis"  . CORONARY ANGIOPLASTY WITH STENT PLACEMENT  01/12/2013  . LEFT HEART CATH N/A 01/12/2013   Procedure: LEFT HEART CATH;  Surgeon: Wellington Hampshire, MD;  Location[facility] CATH LAB;  Service: Cardiovascular;  Laterality: N/A;  . LIVER BIOPSY  2000s   Performed for abnormal LFTs in the setting of  statin use  . MASTECTOMY Bilateral 1992  . PERCUTANEOUS CORONARY STENT INTERVENTION (PCI-S)  01/12/2013   Procedure: PERCUTANEOUS CORONARY STENT INTERVENTION (PCI-S);  Surgeon: Wellington Hampshire, MD;  Location: [facility] CATH LAB;  Service: Cardiovascular;;  Mid CX DES  . PROCTOSCOPY N/A 10/27/2013   Procedure: RIGID PROCTOSCOPY;  Surgeon: Leighton Ruff, MD;  Location: [facility] ORS;  Service: General;  Laterality: N/A;  . RECONSTRUCTION BREAST W/ TRAM FLAP Bilateral 1992  . TONSILLECTOMY  1950s  . TOTAL ABDOMINAL HYSTERECTOMY  2005    Current Outpatient Prescriptions  Medication Sig Dispense Refill  . aspirin 81 MG chewable tablet Chew 81 mg by mouth daily.     . Coenzyme Q10 (COQ-10) 200 MG CAPS Take 200 mg by mouth every evening.     . ezetimibe (ZETIA) 10 MG tablet TAKE ONE TABLET BY MOUTH ONCE DAILY 30 tablet 6  . isosorbide mononitrate (IMDUR) 30 MG 24 hr tablet Take 0.5 tablets (15 mg total) by mouth daily. 30 tablet 6  . metoprolol tartrate (LOPRESSOR) 25 MG tablet TAKE ONE TABLET BY MOUTH TWICE DAILY 60 tablet 10  . Multiple Vitamin (MULTIVITAMIN) tablet Take 2 tablets by mouth daily.    . nitroGLYCERIN (NITROSTAT) 0.4 MG SL tablet Place 1 tablet (0.4 mg total) under the tongue every 5 (five) minutes as needed for chest pain. 25 tablet 5  . prasugrel (EFFIENT) 10 MG TABS tablet Take 1 tablet (10 mg total) by mouth daily. 90 tablet 3  . RA KRILL OIL 500 MG CAPS Take 500 mg by mouth daily.     No current facility-administered medications for this visit.     Allergies:   Statins; Sulfa antibiotics; Dairy aid [lactase]; and Advil [ibuprofen]   Social History:  The patient  reports that she has never smoked. She has never used smokeless tobacco. She reports that she does not drink alcohol or use drugs.   Family History:  The patient's family history includes Healthy in her mother; Heart attack in her father, maternal grandfather, and paternal grandmother; Heart disease in her father.  ROS:   Please see the history of present illness.  All other systems are reviewed and otherwise negative.   PHYSICAL EXAM:  VS:  There were no vitals taken for this visit. BMI: There is no height or weight on file to calculate BMI. Well nourished, well developed WF in no acute distress  HEENT: normocephalic, atraumatic  Neck: no JVD, carotid bruits or masses Cardiac:  normal S1, S2; RRR; no murmurs, rubs, or gallops Lungs:  clear to auscultation bilaterally, no wheezing, rhonchi or rales  Abd: soft, nontender, no hepatomegaly, + BS MS: no deformity or atrophy Ext: no edema. Right radial cath site without hematoma or ecchymosis; good pulse. Skin: warm and dry, no rash Neuro:  moves all extremities spontaneously, no focal abnormalities noted, follows commands Psych: euthymic mood, full affect  EKG:  Done today shows NSR 75bpm, prior inferior MI as well as prior anterolateral MI, nonspecific ST-T changes.  Recent Labs: 03/11/2015: BUN 8; Creatinine, Ser 0.65; Hemoglobin 12.6;  Platelets 264; Potassium 4.0; Sodium 140  03/10/2015: Cholesterol 252; HDL 41; LDL Cholesterol 158; Total CHOL/HDL Ratio 6.1; Triglycerides 265; VLDL 53   CrCl cannot be calculated (Patient's most recent lab result is older than the maximum 21 days allowed.).   Wt Readings from Last 3 Encounters:  07/19/15 152 lb 6.4 oz (69.1 kg)  03/18/15 153 lb (69.4 kg)  03/11/15 152 lb 8.9 oz (69.2 kg)     Other studies reviewed: Additional studies/records reviewed today include: summarized above  ASSESSMENT AND PLAN:  1. CAD s/p PCI as above - doing well post-PCI. Continue ASA, BB, ,Start Effient stop Brillinta due to dyspnea  2. Essential HTN - controlled. 3. Hyperlipidemia - intolerant to statin unable to afford Praluent on study research list for CLEAR but apparently liver failure on statin precludes enrollment will discuss with lipid clinic Not at goal on zetia  May be a candidate for [facility] trial 4. Hyperglycemia - I asked  her to f/u PCP for further monitoring. She reports h/o pre-DM. 5. Liver:  F/u Dr Kalman Shan not on cholesterol meds other than Zetia.  Told her to make sure she gets hepatitis serology  Disposition: F/u with me in a year .  Current medicines are reviewed at length with the patient today.  The patient did not have any concerns regarding medicines.  Jenkins Rouge

## 2016-02-22 ENCOUNTER — Ambulatory Visit: Payer: Self-pay | Admitting: Cardiovascular Disease

## 2016-02-22 NOTE — Progress Notes (Signed)
Cardiology Office Note    Date:  02/23/2016   ID:  Ashley Conley, DOB 1950-08-18, MRN [id number]  PCP:  Imagene Riches, NP  Cardiologist: Dr. Johnsie Cancel  CC: follow up- 6 month.   History of Present Illness:  Ashley Conley is a 66 y.o. female with a history of CAD s/p multiple PCIs, HTN, uncontrolled HLD, hyperglycemia, remote breast CA, h/o diverticulitis with bowel resection in 10/2013 and statin induced liver failure who presents to clinic for follow up.   Per review of history, she initially underwent cardiac catheterization on 01/12/2013 which showed 99% mid left circumflex treated with DES, small-caliber ostial 90% OM 2 jailed by the stent w/o EKG change or symptom, 80% mid to distal LAD treated medically. She had a negative Myoview in January 2015. She had liver failure on statin and was placed on Zetia. She was admitted 12/2013 with diverticulitis and underwent bowel resection. Brilinta was stopped at that time. She has been set up for Praluent by our lipid clinic. She was doing well up until 02/2015 when she developed occasional chest discomfort during exertion. She presented to the hospital 02/2015 where she r/o for MI. She underwent LHC 03/10/15 showing 30% pRCA, 40% m-dRCA, 85% dLAD1, 100% dLAD2, normal LVEF. She had difficult but ultimately successful percutaneous coronary intervention to the 85% LAD stenosis which was complicated by subintimal dissection resulting in no flow phenomena distally, chest pain, and transient ST segment elevation.Ultimately after numerous balloon inflations, a 2.538 mm Xience Alpine DES stent was inserted in the mid LAD distally and PTCA alone was performed in the apical segment which resulted in restoration of TIMI-3 flow and residual narrowing of 0% at all sites. LVEF 55-65%. Post cath troponin was checked and was 0.57. DAPT was recommended, indefinitely if possible.   She was seen by Dr. Johnsie Cancel in 07/2015. Her Brilinta was stopped due to shortness of breath and she  was started on Effient. She was not able to afford Praluent. She had mildly elevated LFTs and she was told to follow up with her PCP. She was referred for a research study for PCSK9 inhibitors but was denied given her statin-induced liver failure.  Today she presents for six-month follow-up. The SOB got a lot better after switching to Effient. However in December her insurance changed all of her meds to generic. The effient generic caused her severe HAs and itchy ankles and she self discontinued. No CP or SOB. No LE edema, orthopnea or PND. No dizziness or syncope. No blood in stool or urine. No palpitations. She has been exercising on the elliptical and treadmill. She has been having some back pain. She went to a chiropractor and that really helped.     Past Medical History:  Diagnosis Date  . CAD (coronary artery disease)    a. a. S/p DES to LCX 12/2012. b. 02/2015: s/p successful but difficult PTCA to dLAD-1 c/b subintimal dissection requiring balloon inflations/DES, and PTCA to dLAD-2.  Marland Kitchen Cancer of right breast ([place]) 1992  . Diverticulitis    a. bowel resection in 10/2013.  Marland Kitchen Hyperglycemia   . Hyperlipidemia    a. Statins caused to elevated LFTs.  . Hypertension   . NSTEMI (non-ST elevated myocardial infarction) ([place]) 12/2012    Past Surgical History:  Procedure Laterality Date  . BREAST BIOPSY Right 1990  . BREAST RECONSTRUCTION Right 1990   w/implant  . CARDIAC CATHETERIZATION N/A 03/10/2015   Procedure: Left Heart Cath and Coronary Angiography;  Surgeon: Troy Sine, MD;  Location: [facility];  Service: Cardiovascular;  Laterality: N/A;  . COLECTOMY  2015   "for diverticulitis"  . CORONARY ANGIOPLASTY WITH STENT PLACEMENT  01/12/2013  . LEFT HEART CATH N/A 01/12/2013   Procedure: LEFT HEART CATH;  Surgeon: Wellington Hampshire, MD;  Location: [facility] CATH LAB;  Service: Cardiovascular;  Laterality: N/A;  . LIVER BIOPSY  2000s   Performed for abnormal LFTs in the setting of  statin use  . MASTECTOMY Bilateral 1992  . PERCUTANEOUS CORONARY STENT INTERVENTION (PCI-S)  01/12/2013   Procedure: PERCUTANEOUS CORONARY STENT INTERVENTION (PCI-S);  Surgeon: Wellington Hampshire, MD;  Location: [facility] CATH LAB;  Service: Cardiovascular;;  Mid CX DES  . PROCTOSCOPY N/A 10/27/2013   Procedure: RIGID PROCTOSCOPY;  Surgeon: Leighton Ruff, MD;  Location: [facility];  Service: General;  Laterality: N/A;  . RECONSTRUCTION BREAST W/ TRAM FLAP Bilateral 1992  . TONSILLECTOMY  1950s  . TOTAL ABDOMINAL HYSTERECTOMY  2005    Current Medications: Outpatient Medications Prior to Visit  Medication Sig Dispense Refill  . aspirin 81 MG chewable tablet Chew 81 mg by mouth daily.     . Coenzyme Q10 (COQ-10) 200 MG CAPS Take 200 mg by mouth every evening.     . ezetimibe (ZETIA) 10 MG tablet TAKE ONE TABLET BY MOUTH ONCE DAILY 30 tablet 6  . isosorbide mononitrate (IMDUR) 30 MG 24 hr tablet Take 0.5 tablets (15 mg total) by mouth daily. 30 tablet 6  . metoprolol tartrate (LOPRESSOR) 25 MG tablet TAKE ONE TABLET BY MOUTH TWICE DAILY 60 tablet 10  . Multiple Vitamin (MULTIVITAMIN) tablet Take 2 tablets by mouth daily.    . nitroGLYCERIN (NITROSTAT) 0.4 MG SL tablet Place 1 tablet (0.4 mg total) under the tongue every 5 (five) minutes as needed for chest pain. 25 tablet 5  . prasugrel (EFFIENT) 10 MG TABS tablet Take 1 tablet (10 mg total) by mouth daily. 90 tablet 3  . RA KRILL OIL 500 MG CAPS Take 500 mg by mouth daily.     No facility-administered medications prior to visit.      Allergies:   Statins; Sulfa antibiotics; Dairy aid [lactase]; and Advil [ibuprofen]   Social History   Social History  . Marital status: Married    Spouse name: N/A  . Number of children: N/A  . Years of education: N/A   Social History Main Topics  . Smoking status: Never Smoker  . Smokeless tobacco: Never Used  . Alcohol use No  . Drug use: No  . Sexual activity: Yes    Birth control/ protection:  Post-menopausal   Other Topics Concern  . None   Social History Narrative  . None     Family History:  The patient's family history includes Healthy in her mother; Heart attack in her father, maternal grandfather, and paternal grandmother; Heart disease in her father.      ROS:   Please see the history of present illness.    ROS All other systems reviewed and are negative.   PHYSICAL EXAM:   VS:  BP 114/68 (BP Location: Left Arm, Patient Position: Sitting, Cuff Size: Normal)   Pulse 64   Ht 5\' 2"  (1.575 m)   Wt 156 lb (70.8 kg)   BMI 28.53 kg/m    GEN: Well nourished, well developed, in no acute distress  HEENT: normal  Neck: no JVD, carotid bruits, or masses Cardiac: RRR; no murmurs, rubs, or gallops,no edema  Respiratory:  clear to auscultation bilaterally,  normal work of breathing GI: soft, nontender, nondistended, + BS MS: no deformity or atrophy  Skin: warm and dry, no rash Neuro:  Alert and Oriented x 3, Strength and sensation are intact Psych: euthymic mood, full affect   Wt Readings from Last 3 Encounters:  02/23/16 156 lb (70.8 kg)  07/19/15 152 lb 6.4 oz (69.1 kg)  03/18/15 153 lb (69.4 kg)      Studies/Labs Reviewed:   EKG:  EKG is ordered today.  The ekg ordered today demonstrates NSR with sinus arrhythmia  HR 68   Recent Labs: 03/11/2015: BUN 8; Creatinine, Ser 0.65; Hemoglobin 12.6; Platelets 264; Potassium 4.0; Sodium 140   Lipid Panel    Component Value Date/Time   CHOL 252 (H) 03/10/2015 0324   TRIG 265 (H) 03/10/2015 0324   HDL 41 03/10/2015 0324   CHOLHDL 6.1 03/10/2015 0324   VLDL 53 (H) 03/10/2015 0324   LDLCALC 158 (H) 03/10/2015 0324    Additional studies/ records that were reviewed today include:  03/10/15 Left Heart Cath and Coronary Angiography  Conclusion   Prox RCA lesion, 30% stenosed.  Mid RCA to Dist RCA lesion, 40% stenosed.  Dist LAD-1 lesion, 85% stenosed. Post intervention, there is a 0% residual stenosis. The  lesion was not previously treated.  Dist LAD-2 lesion, 100% stenosed. Post intervention, there is a 0% residual stenosis.  The left ventricular systolic function is normal.   Normal LV function.  Progressive native coronary obstructive disease with eccentric 85% somewhat calcified mid LAD stenosis; widely patent stent in the left circumflex coronary artery; and a dominant RCA with smooth 30% proximal and 30-40% narrowing in the region of the acute margin.  Difficult but ultimately successful percutaneous coronary intervention to the 85% mid LAD stenosis which was complicated by subintimal dissection resulting in no flow phenomena distally, chest pain, and transient ST segment elevation.  Ultimately after numerous balloon inflations, a 2.538 mm Xience Alpine DES stent was inserted in the mid LAD distally and PTCA alone was performed in the apical segment which resulted in restoration of TIMI-3 flow and residual narrowing of 0% at all sites. RECOMMENDATION: Patient should continue dual antiplatelet therapy, probably indefinitely.     ASSESSMENT & PLAN:   CAD: continue ASA and BB. Has a history of statin induced liver failure. Dyspnea improved on Effient but switched to generic and this caused HA and itchy feet and she self discontinued this. Will start plavix 75mg  daily.   Essential HTN: Bp well controlled today.   HLD: Most recent LDL 158 03/2015. Continue Zetia. Had statin induced liver failure. Could not afford praulent and was turned down for study given elevated LFTs. Will check lipids/LFTs at her earliest convenience.   Pre diabetes: HgA1c was 6.5 two years ago.    Medication Adjustments/Labs and Tests Ordered: Current medicines are reviewed at length with the patient today.  Concerns regarding medicines are outlined above.  Medication changes, Labs and Tests ordered today are listed in the Patient Instructions below. Patient Instructions  Medication Instructions:  Your physician  has recommended you make the following change in your medication:  1.  STOP the Effient 2.  START Plavix 75 mg taking 1 tablet daily   Labwork: LIPID & LFT AT YOUR CONVENIENCE  Testing/Procedures: None ordered  Follow-Up: Your physician wants you to follow-up in: [place] DR. Blima Singer will receive a reminder letter in the mail two months in advance. If you don't receive a letter, please call our  office to schedule the follow-up appointment.   Any Other Special Instructions Will Be Listed Below (If Applicable).     If you need a refill on your cardiac medications before your next appointment, please call your pharmacy.      Signed, Angelena Form, PA-C  02/23/2016 3:11 PM    [facility] [address], [place], [place]  [address] Phone: [phone]; Fax: [phone]

## 2016-02-23 ENCOUNTER — Ambulatory Visit (INDEPENDENT_AMBULATORY_CARE_PROVIDER_SITE_OTHER): Payer: Medicare Other | Admitting: Physician Assistant

## 2016-02-23 ENCOUNTER — Encounter: Payer: Self-pay | Admitting: Physician Assistant

## 2016-02-23 VITALS — BP 114/68 | HR 64 | Ht 62.0 in | Wt 156.0 lb

## 2016-02-23 DIAGNOSIS — E785 Hyperlipidemia, unspecified: Secondary | ICD-10-CM | POA: Diagnosis not present

## 2016-02-23 DIAGNOSIS — I10 Essential (primary) hypertension: Secondary | ICD-10-CM | POA: Diagnosis not present

## 2016-02-23 DIAGNOSIS — I2510 Atherosclerotic heart disease of native coronary artery without angina pectoris: Secondary | ICD-10-CM | POA: Diagnosis not present

## 2016-02-23 DIAGNOSIS — R7303 Prediabetes: Secondary | ICD-10-CM | POA: Diagnosis not present

## 2016-02-23 MED ORDER — CLOPIDOGREL BISULFATE 75 MG PO TABS: 75.0000 mg | ORAL_TABLET | Freq: Every day | ORAL | 3 refills | Status: DC

## 2016-02-23 NOTE — Patient Instructions (Signed)
Medication Instructions:  Your physician has recommended you make the following change in your medication:  1.  STOP the Effient 2.  START Plavix 75 mg taking 1 tablet daily   Labwork: LIPID & LFT AT YOUR CONVENIENCE  Testing/Procedures: None ordered  Follow-Up: Your physician wants you to follow-up in: [place] DR. Blima Singer will receive a reminder letter in the mail two months in advance. If you don't receive a letter, please call our office to schedule the follow-up appointment.   Any Other Special Instructions Will Be Listed Below (If Applicable).     If you need a refill on your cardiac medications before your next appointment, please call your pharmacy.

## 2016-03-31 IMAGING — CT CT PELVIS W/O CM
1 series · 15 of 32 positions shown, 19 images · non-contrast
Comparison: CT 10/06/2011.

CLINICAL DATA: Right groin pain. Report stool from the vagina.
Evaluate for rectovaginal fistula. History of complete hysterectomy.

EXAM:
CT PELVIS WITHOUT CONTRAST
TECHNIQUE: Multidetector CT imaging of the pelvis was performed following the
standard protocol without intravenous contrast.

[Series 2: routine abdomen/pelvis with · axial · 0.78mm/px · z∈[-511,-286]mm · 15 of 51 slices shown, 19 images]
[im 4/51  soft-tissue]
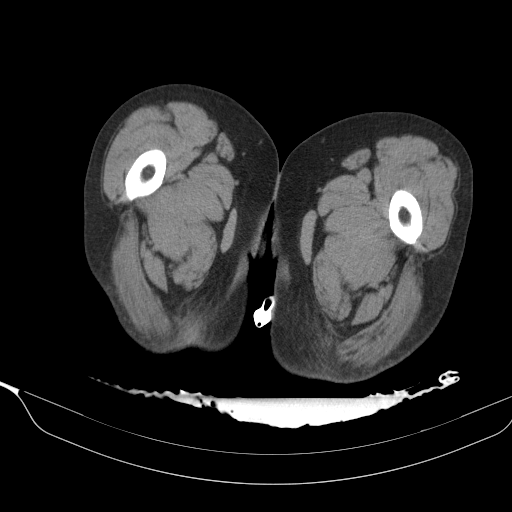
[im 4/51  bone]
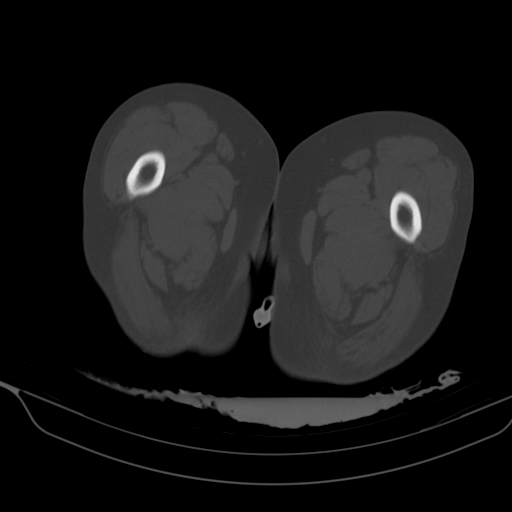
[im 7/51  soft-tissue]
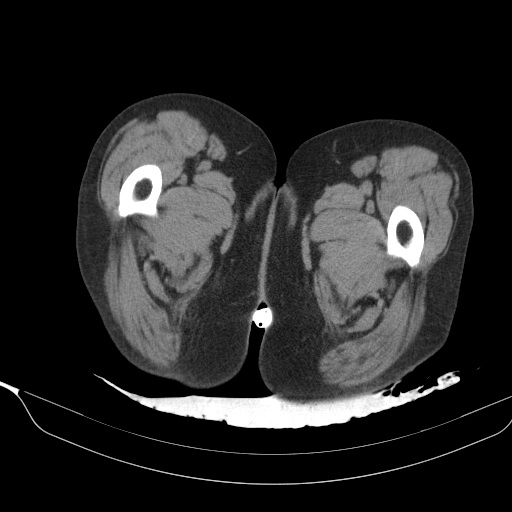
[im 10/51  soft-tissue]
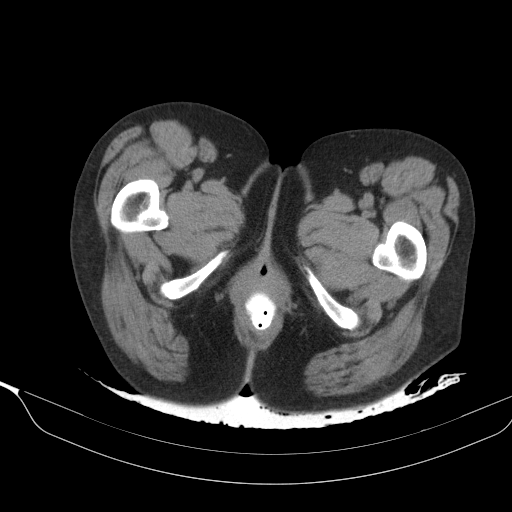
[im 15/51  soft-tissue]
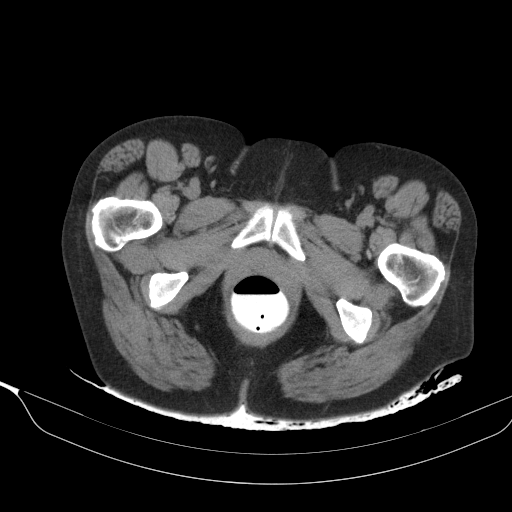
[im 18/51  soft-tissue]
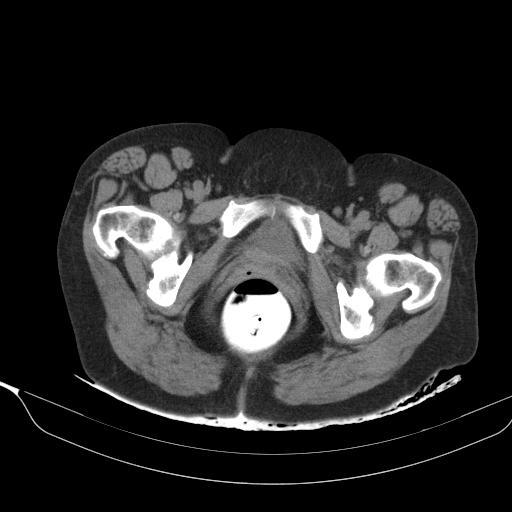
[im 21/51  soft-tissue]
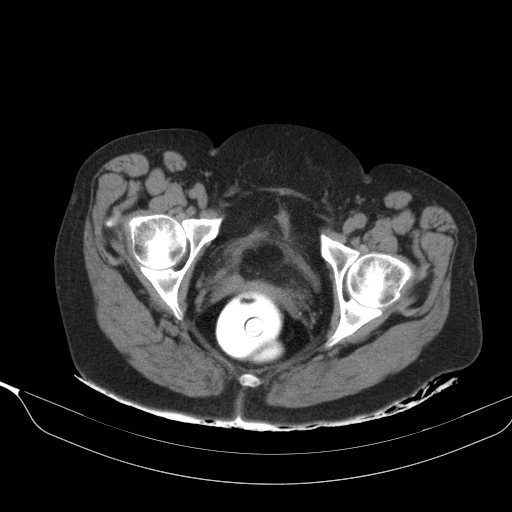
[im 26/51  soft-tissue]
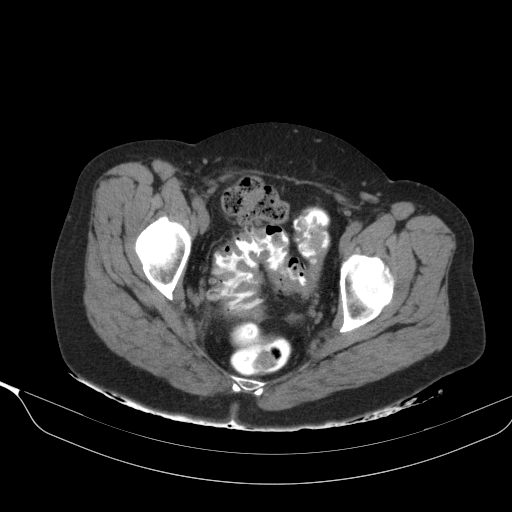
[im 30/51  soft-tissue]
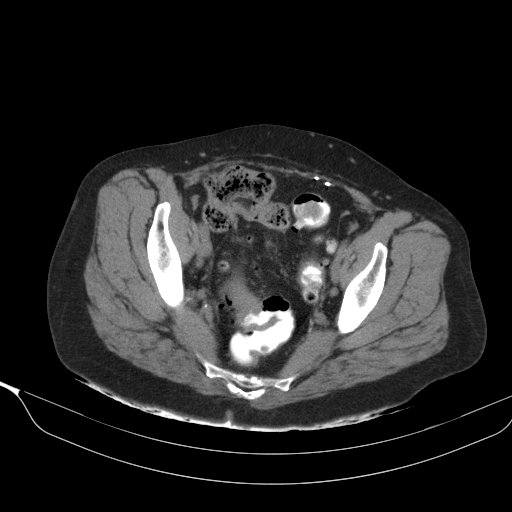
[im 33/51  soft-tissue]
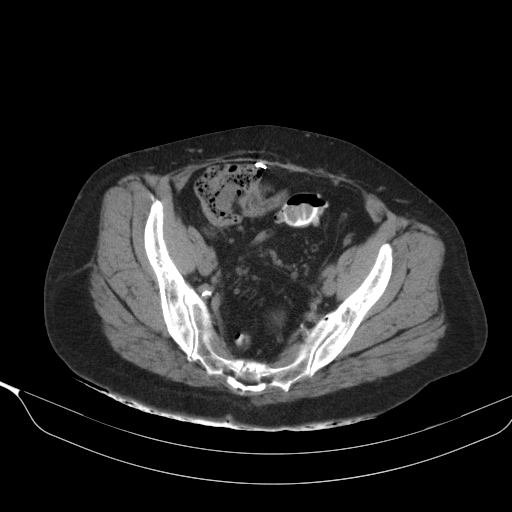
[im 33/51  bone]
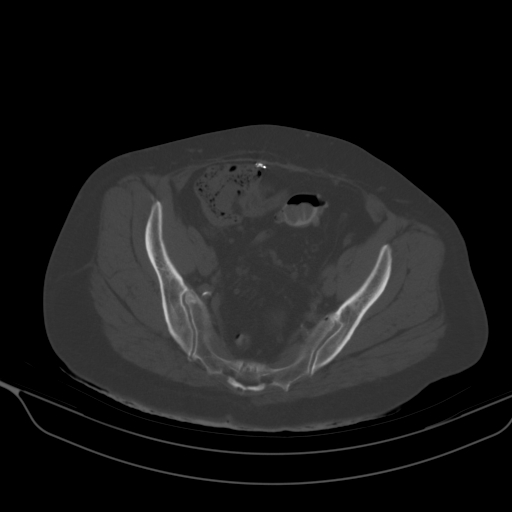
[im 36/51  soft-tissue]
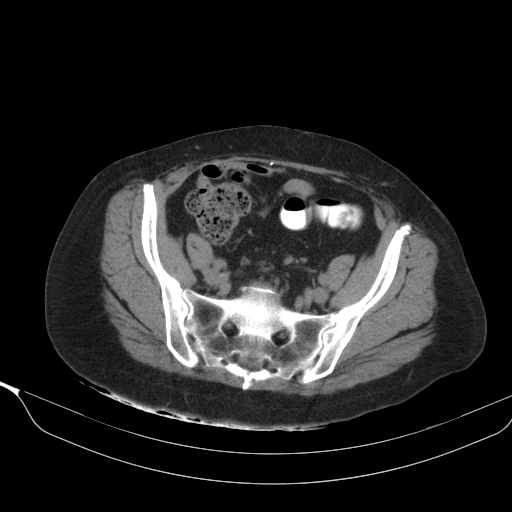
[im 41/51  soft-tissue]
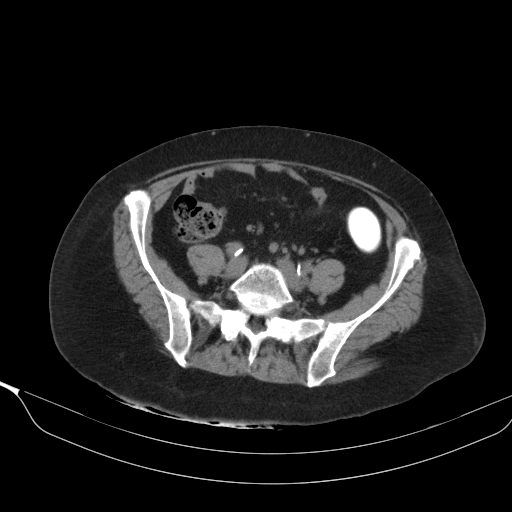
[im 44/51  soft-tissue]
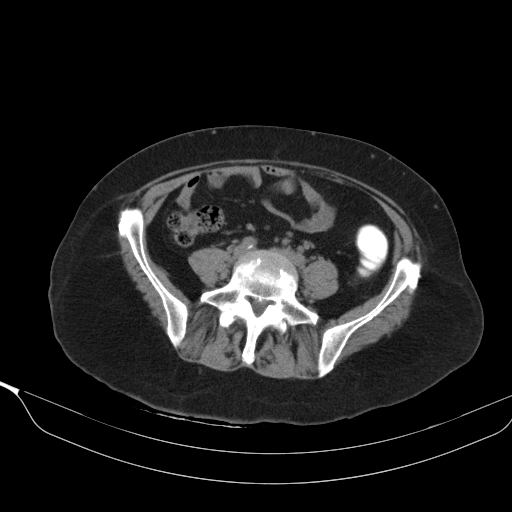
[im 44/51  lung]
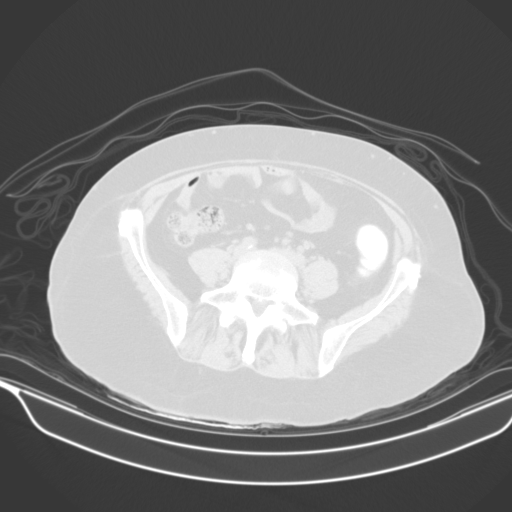
[im 46/51  lung]
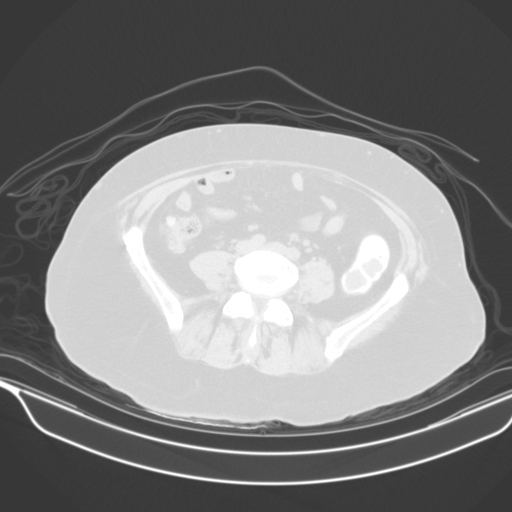
[im 47/51  soft-tissue]
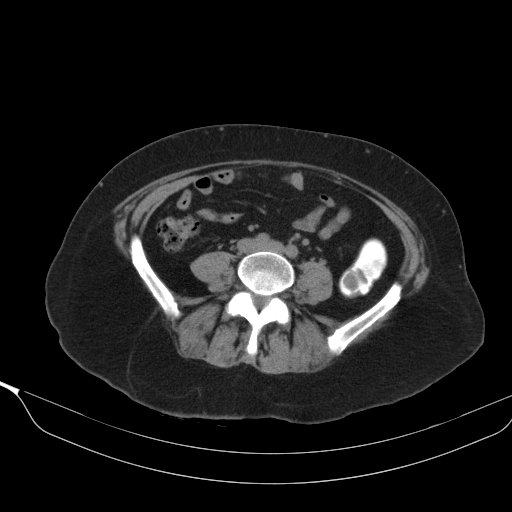
[im 47/51  lung]
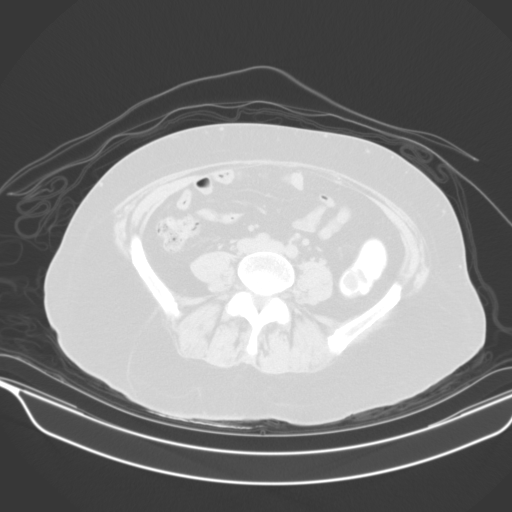
[im 49/51  lung]
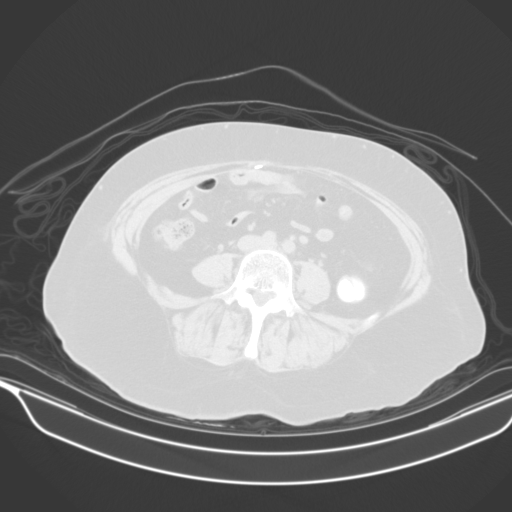

[15 of 32 positions shown; findings below may reference images not displayed]

FINDINGS: Rectal tube in place. Contrast material is demonstrated within the
rectum. There is soft tissue thickening along the descending sigmoid
colon which extends from the colonic margin to the right lateral
superior aspect of the vagina (image [id number]; series 2) (image
43; series 602) (image 46; series 603).

Sigmoid and descending colonic diverticulosis. There is
circumferential wall thickening of the sigmoid colon. Multiple
prominent subcentimeter lymph nodes are demonstrated within colonic
mesentery measuring up to 7 mm (image 21; series 2). Status post
hysterectomy. No definite contrast is demonstrated within the
vaginal vault. Urinary bladder is decompressed.

Lower lumbar spine degenerative change.
IMPRESSION: Soft tissue thickening of the descending colon. There is irregular
soft tissue extending from the sigmoid colon to the right lateral
aspect of the vagina with surrounding fat stranding. No definite
contrast is demonstrated coursing from the sigmoid colon/rectum into
the vagina on this examination. Given the clinical history and
irregular soft tissue connection between the colon and vagina, this
is concerning for colono-vaginal fistula. Further examination with
fluoroscopic study could be performed as clinically indicated.

Soft tissue thickening and surrounding fat stranding is favored to
be inflammatory from possible fistulous connection and prior
episodes of diverticulitis. Underlying malignant etiology would not
be entirely excluded.

Multiple prominent sub cm pelvic lymph nodes likely reactive as they
demonstrate stability dating back to 7061.

Recommend colonoscopy for evaluation of the diffuse colonic wall
thickening if not previously performed. While this may be secondary
to prior episodes of colitis, underlying malignancy would not be
entirely excluded.

## 2016-04-27 ENCOUNTER — Other Ambulatory Visit: Payer: Self-pay | Admitting: Physician Assistant

## 2016-05-31 IMAGING — CR DG CHEST 1V PORT
1 series · 1 of 1 positions shown · non-contrast
Comparison: 01/12/2013

CLINICAL DATA: Generalized weakness, chest pain, nausea

EXAM:
PORTABLE CHEST - 1 VIEW

[portable]
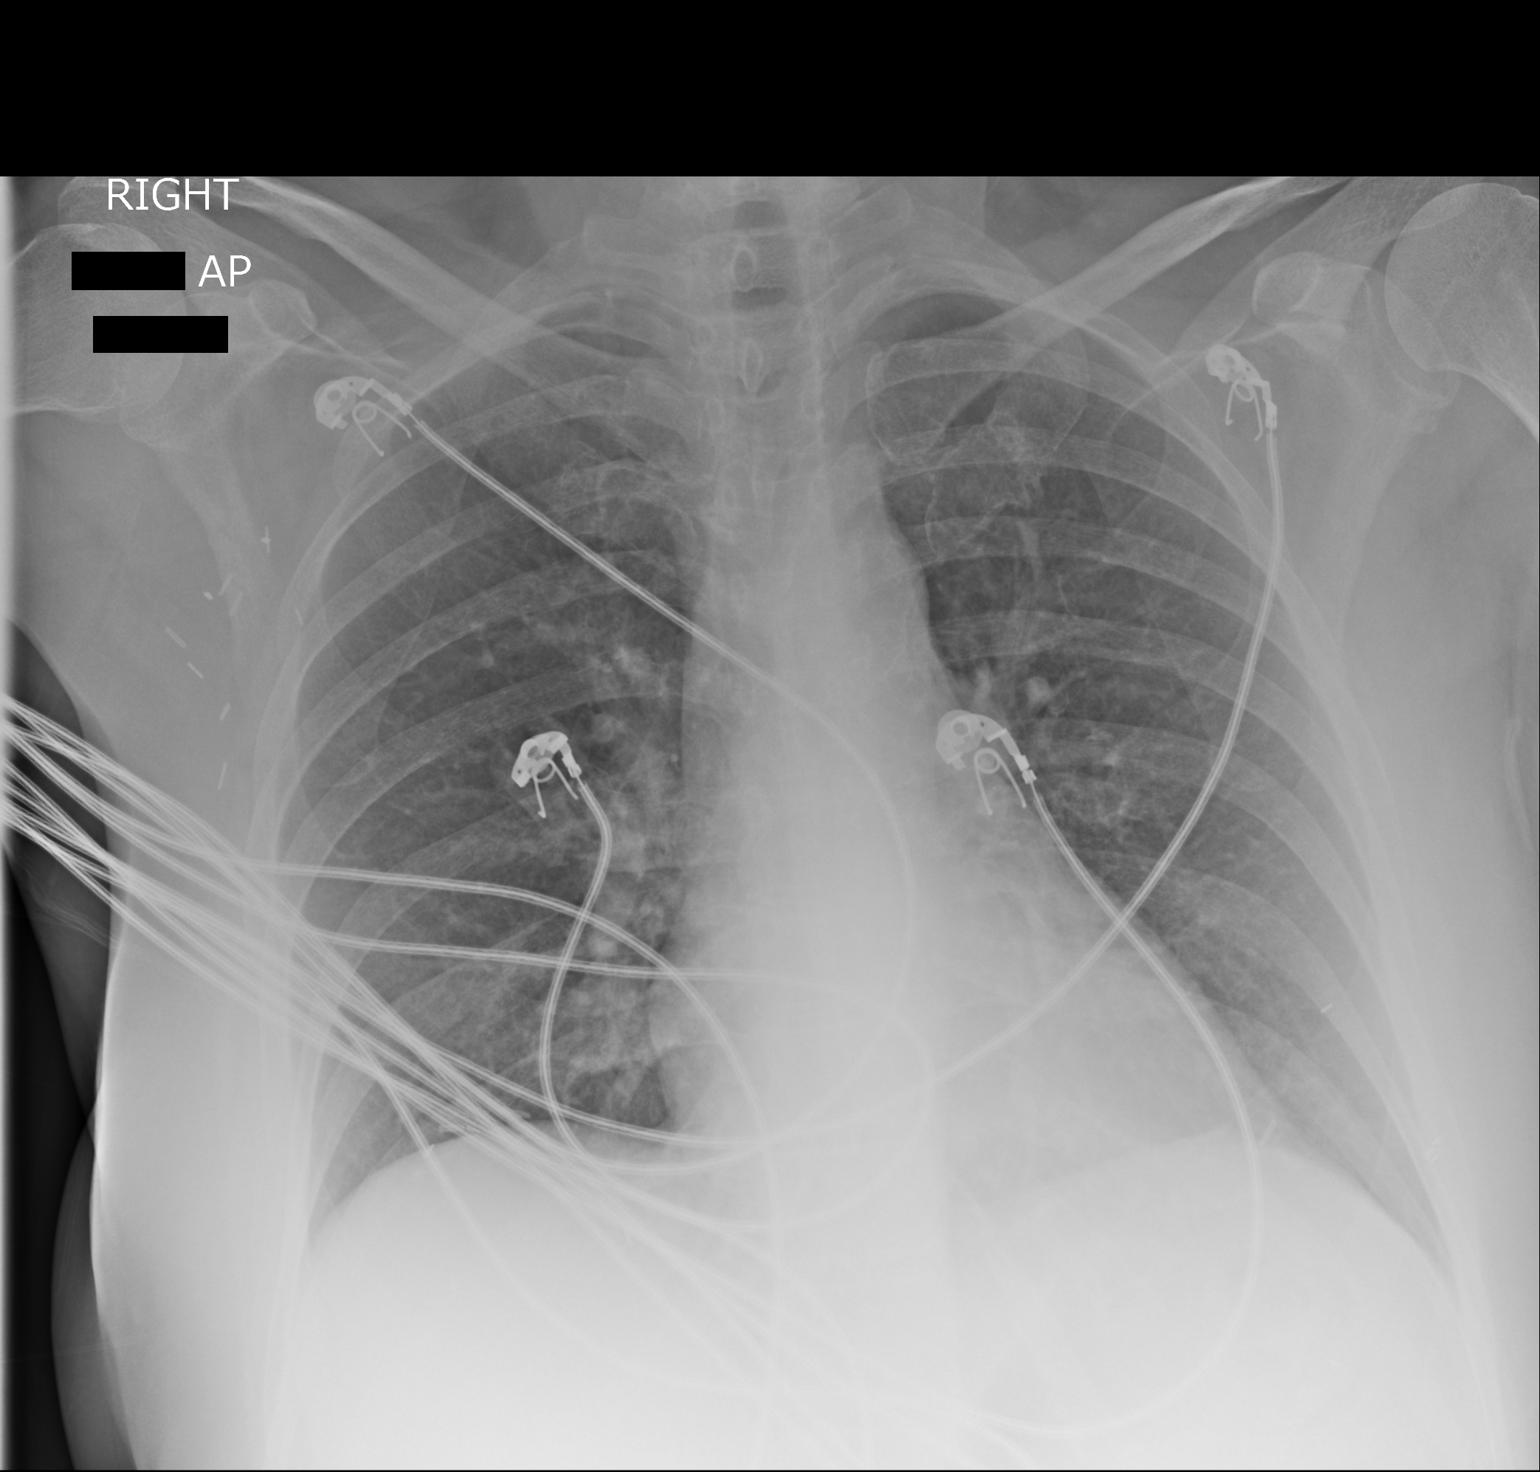

[1 of 1 positions shown; findings below may reference images not displayed]

FINDINGS: Monitor leads overlie the chest. Right axillary surgical clips
noted. Normal heart size and vascularity. No focal pneumonia,
collapse or consolidation. Negative for edema, effusion or
pneumothorax. Trachea midline. Stable exam.
IMPRESSION: Postop changes.  No definite acute process.

## 2016-08-17 ENCOUNTER — Other Ambulatory Visit: Payer: Self-pay | Admitting: Cardiovascular Disease

## 2016-09-14 ENCOUNTER — Other Ambulatory Visit: Payer: Self-pay | Admitting: Cardiovascular Disease

## 2017-02-21 ENCOUNTER — Other Ambulatory Visit: Payer: Self-pay | Admitting: Cardiovascular Disease

## 2017-03-19 ENCOUNTER — Other Ambulatory Visit: Payer: Self-pay | Admitting: Cardiovascular Disease

## 2017-03-23 ENCOUNTER — Other Ambulatory Visit: Payer: Self-pay | Admitting: Cardiovascular Disease

## 2017-04-04 NOTE — Progress Notes (Signed)
Cardiology Office Note    Date:  04/13/2017   ID:  Ashley Conley, DOB [birth year], MRN [id number]  PCP:  Imagene Riches, NP  Cardiologist: Dr. Johnsie Cancel  CC: follow up- 6 month.   History of Present Illness:   67 y.o. with remote Breast CA, diverticulitis with bowel resection Multiple interventions for CAD. 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 CAD (coronary artery disease)    a. a. S/p DES to LCX 12/2012. b. 02/2015: s/p successful but difficult PTCA to dLAD-1 c/b subintimal dissection requiring balloon inflations/DES, and PTCA to dLAD-2.  Marland Kitchen Cancer of right breast ([place]) 1992  . Diverticulitis    a. bowel resection in 10/2013.  Marland Kitchen Hyperglycemia   . Hyperlipidemia    a. Statins caused to elevated LFTs.  . Hypertension   . NSTEMI (non-ST elevated myocardial infarction) ([place]) 12/2012    Past Surgical History:  Procedure Laterality Date  . BREAST BIOPSY Right 1990  . BREAST RECONSTRUCTION Right 1990   w/implant  . CARDIAC CATHETERIZATION N/A 03/10/2015   Procedure: Left Heart Cath and Coronary Angiography;  Surgeon: Troy Sine, MD;  Location: [facility];  Service: Cardiovascular;  Laterality: N/A;  . COLECTOMY  2015   "for diverticulitis"  . CORONARY ANGIOPLASTY WITH STENT PLACEMENT  01/12/2013  . LEFT HEART CATH N/A 01/12/2013   Procedure: LEFT HEART CATH;  Surgeon: Wellington Hampshire, MD;  Location: [facility] CATH LAB;  Service: Cardiovascular;  Laterality: N/A;  . LIVER BIOPSY   2000s   Performed for abnormal LFTs in the setting of statin use  . MASTECTOMY Bilateral 1992  . PERCUTANEOUS CORONARY STENT INTERVENTION (PCI-S)  01/12/2013   Procedure: PERCUTANEOUS CORONARY STENT INTERVENTION (PCI-S);  Surgeon: Wellington Hampshire, MD;  Location: [facility] CATH LAB;  Service: Cardiovascular;;  Mid CX DES  . PROCTOSCOPY N/A 10/27/2013   Procedure: RIGID PROCTOSCOPY;  Surgeon: Leighton Ruff, MD;  Location: [facility];  Service: General;  Laterality: N/A;  . RECONSTRUCTION BREAST W/ TRAM FLAP Bilateral 1992  . TONSILLECTOMY  1950s  . TOTAL ABDOMINAL HYSTERECTOMY  2005    Current Medications: Outpatient Medications Prior to Visit  Medication Sig Dispense Refill  . aspirin 81 MG chewable tablet Chew 81 mg by mouth daily.     . clopidogrel (PLAVIX) 75 MG tablet Take 1 tablet (75 mg total) by mouth daily. 90 tablet 3  . Coenzyme Q10 (COQ-10) 200 MG CAPS Take 200 mg by mouth every evening.     . Multiple Vitamin (MULTIVITAMIN) tablet Take 2 tablets by mouth daily.    . nitroGLYCERIN (NITROSTAT) 0.4 MG SL tablet Place 1 tablet (0.4 mg total) under the tongue every 5 (five) minutes as needed for chest pain. 25 tablet 5  . ezetimibe (ZETIA) 10 MG tablet Take 1 tablet (10 mg total) by mouth daily. Please make overdue yearly appt with Dr. Johnsie Cancel before anymore refills. 2nd attempt 15 tablet 0  . isosorbide mononitrate (  IMDUR) 30 MG 24 hr tablet Take 0.5 tablets (15 mg total) by mouth daily. 45 tablet 3  . metoprolol tartrate (LOPRESSOR) 25 MG tablet Take 1 tablet (25 mg total) by mouth 2 (two) times daily. Please keep upcoming appt. 180 tablet 0   No facility-administered medications prior to visit.      Allergies:   Statins; Sulfa antibiotics; Dairy aid [lactase]; and Advil [ibuprofen]   Social History   Socioeconomic History  . Marital status: Married    Spouse name: None  . Number of children: None  . Years of education: None  . Highest education level: None  Social Needs  .  Financial resource strain: None  . Food insecurity - worry: None  . Food insecurity - inability: None  . Transportation needs - medical: None  . Transportation needs - non-medical: None  Occupational History  . None  Tobacco Use  . Smoking status: Never Smoker  . Smokeless tobacco: Never Used  Substance and Sexual Activity  . Alcohol use: No  . Drug use: No  . Sexual activity: Yes    Birth control/protection: Post-menopausal  Other Topics Concern  . None  Social History Narrative  . None     Family History:  The patient's family history includes Healthy in her mother; Heart attack in her father, maternal grandfather, and paternal grandmother; Heart disease in her father.      ROS:   Please see the history of present illness.    ROS All other systems reviewed and are negative.   PHYSICAL EXAM:   VS:  BP 120/78   Pulse 67   Ht 5\' 2"  (1.575 m)   Wt 148 lb (67.1 kg)   SpO2 98%   BMI 27.07 kg/m    Affect appropriate Healthy:  appears stated age 57: normal Neck supple with no adenopathy JVP normal no bruits no thyromegaly Lungs clear with no wheezing and good diaphragmatic motion Heart:  S1/S2 no murmur, no rub, gallop or click PMI normal Abdomen: benighn, BS positve, no tenderness, no AAA no bruit.  No HSM or HJR Distal pulses intact with no bruits No edema Neuro non-focal Skin warm and dry new flower tattoo over right wrist  No muscular weakness    Wt Readings from Last 3 Encounters:  04/13/17 148 lb (67.1 kg)  02/23/16 156 lb (70.8 kg)  07/19/15 152 lb 6.4 oz (69.1 kg)      Studies/Labs Reviewed:   EKG:  EKG is ordered today.  The ekg ordered today demonstrates NSR with sinus arrhythmia  HR 68  04/13/17 SR rate 54 normal  Recent Labs: No results found for requested labs within last 8760 hours.   Lipid Panel    Component Value Date/Time   CHOL 252 (H) 03/10/2015 0324   TRIG 265 (H) 03/10/2015 0324   HDL 41 03/10/2015 0324   CHOLHDL 6.1  03/10/2015 0324   VLDL 53 (H) 03/10/2015 0324   LDLCALC 158 (H) 03/10/2015 0324    Additional studies/ records that were reviewed today include:  03/10/15 Left Heart Cath and Coronary Angiography  Conclusion   Prox RCA lesion, 30% stenosed.  Mid RCA to Dist RCA lesion, 40% stenosed.  Dist LAD-1 lesion, 85% stenosed. Post intervention, there is a 0% residual stenosis. The lesion was not previously treated.  Dist LAD-2 lesion, 100% stenosed. Post intervention, there is a 0% residual stenosis.  The left ventricular systolic function is normal.   Normal LV function.  Progressive native coronary obstructive disease  with eccentric 85% somewhat calcified mid LAD stenosis; widely patent stent in the left circumflex coronary artery; and a dominant RCA with smooth 30% proximal and 30-40% narrowing in the region of the acute margin.  Difficult but ultimately successful percutaneous coronary intervention to the 85% mid LAD stenosis which was complicated by subintimal dissection resulting in no flow phenomena distally, chest pain, and transient ST segment elevation.  Ultimately after numerous balloon inflations, a 2.538 mm Xience Alpine DES stent was inserted in the mid LAD distally and PTCA alone was performed in the apical segment which resulted in restoration of TIMI-3 flow and residual narrowing of 0% at all sites. RECOMMENDATION: Patient should continue dual antiplatelet therapy, probably indefinitely.     ASSESSMENT & PLAN:   CAD: continue ASA and BB. Dyspnea with Brilinta and pruritis headache with Effient continue plavix   Essential HTN: Well controlled.  Continue current medications and low sodium Dash type diet.    HLD: Most recent LDL 158 03/2015. Needs repeating liver failure with statin and turned down for praluent assistence Will refer to lipid clinic see if Repatha can be paid for or enrolled in research trial   Pre diabetes: HgA1c was 6.5 two years ago.   Jenkins Rouge

## 2017-04-13 ENCOUNTER — Ambulatory Visit (INDEPENDENT_AMBULATORY_CARE_PROVIDER_SITE_OTHER): Payer: Medicare Other | Admitting: Cardiovascular Disease

## 2017-04-13 ENCOUNTER — Encounter: Payer: Self-pay | Admitting: Cardiovascular Disease

## 2017-04-13 VITALS — BP 120/78 | HR 67 | Ht 62.0 in | Wt 148.0 lb

## 2017-04-13 DIAGNOSIS — E785 Hyperlipidemia, unspecified: Secondary | ICD-10-CM | POA: Diagnosis not present

## 2017-04-13 DIAGNOSIS — I2510 Atherosclerotic heart disease of native coronary artery without angina pectoris: Secondary | ICD-10-CM

## 2017-04-13 DIAGNOSIS — I10 Essential (primary) hypertension: Secondary | ICD-10-CM | POA: Diagnosis not present

## 2017-04-13 MED ORDER — EZETIMIBE 10 MG PO TABS: 10.0000 mg | ORAL_TABLET | Freq: Every day | ORAL | 3 refills | Status: DC

## 2017-04-13 MED ORDER — METOPROLOL TARTRATE 25 MG PO TABS: 25.0000 mg | ORAL_TABLET | Freq: Two times a day (BID) | ORAL | 3 refills | Status: DC

## 2017-04-13 MED ORDER — ISOSORBIDE MONONITRATE ER 30 MG PO TB24: 15.0000 mg | ORAL_TABLET | Freq: Every day | ORAL | 3 refills | Status: DC

## 2017-04-13 NOTE — Patient Instructions (Addendum)
Medication Instructions:  Your physician recommends that you continue on your current medications as directed. Please refer to the Current Medication list given to you today.  Labwork: NONE  Testing/Procedures: NONE  Follow-Up: Your physician recommends that you schedule a follow-up appointment in: [facility].  Your physician wants you to follow-up in: 6 months with Dr. Johnsie Cancel. You will receive a reminder letter in the mail two months in advance. If you don't receive a letter, please call our office to schedule the follow-up appointment.   If you need a refill on your cardiac medications before your next appointment, please call your pharmacy.

## 2017-05-01 ENCOUNTER — Ambulatory Visit (INDEPENDENT_AMBULATORY_CARE_PROVIDER_SITE_OTHER): Payer: Medicare Other | Admitting: Pharmacist

## 2017-05-01 DIAGNOSIS — I2510 Atherosclerotic heart disease of native coronary artery without angina pectoris: Secondary | ICD-10-CM

## 2017-05-01 DIAGNOSIS — E782 Mixed hyperlipidemia: Secondary | ICD-10-CM

## 2017-05-01 LAB — LDL CHOLESTEROL, DIRECT: LDL Direct: 151 mg/dL — ABNORMAL HIGH (ref 0–99)

## 2017-05-01 LAB — HEPATIC FUNCTION PANEL
ALK PHOS: 81 IU/L (ref 39–117)
ALT: 73 IU/L — ABNORMAL HIGH (ref 0–32)
AST: 58 IU/L — AB (ref 0–40)
Albumin: 4.4 g/dL (ref 3.6–4.8)
Bilirubin Total: 0.3 mg/dL (ref 0.0–1.2)
Bilirubin, Direct: 0.1 mg/dL (ref 0.00–0.40)
TOTAL PROTEIN: 7.1 g/dL (ref 6.0–8.5)

## 2017-05-01 LAB — LIPID PANEL
CHOL/HDL RATIO: 5.6 ratio — AB (ref 0.0–4.4)
Cholesterol, Total: 223 mg/dL — ABNORMAL HIGH (ref 100–199)
HDL: 40 mg/dL (ref >39)
LDL Calculated: 121 mg/dL — ABNORMAL HIGH (ref 0–99)
Triglycerides: 309 mg/dL — ABNORMAL HIGH (ref 0–149)
VLDL Cholesterol Cal: 62 mg/dL — ABNORMAL HIGH (ref 5–40)

## 2017-05-01 NOTE — Patient Instructions (Signed)
It was nice to see you today  I will start paperwork for Ashley Conley for your cholesterol

## 2017-05-01 NOTE — Progress Notes (Signed)
Patient ID: Ashley Conley                 DOB: 1951/01/31                    MRN: [id number]     HPI: Ashley Conley is a 67 y.o. female patient referred to lipid clinic by Ashley Conley. PMH is significant for CAD with NSTEMI s/p PCI to circumflex in November 2014, unstable angina, HLD, and HTN.  Patient previously tried a statin ~10 years ago (either Lipitor or Zocor).  She states her doctor checked her LFTs after one month and they were fine.  At her 6 month check-up, she states her enzymes were so elevated that they admitted her straight to the hospital. She did have a liver biopsy that was normal.  She was advised to never take a statin again and has never had a rechallenge of statin since this episode. She was then started on Praluent but had difficulty using the pre-filled pens. She switched to [facility] insurance after a few injections and was then unable to afford treatment. She was referred to CLEAR clinical trial, however she screen-failed due to potential for statin-induced liver failure.  Pt eats a low fat diet and stays active using her treadmill every other day and walking her dogs twice daily. She has not had her cholesterol checked since 2016. She is not fasting today but has had 1 cup of coffee and 1 piece of toast.  Pt also brings in a list of her medications that she is taking. The only medication discrepancy noted is her Plavix. She was advised to start this during January 2018 visit but reports that she only took it for 6 weeks before stopping therapy. She does not recall why she stopped taking Plavix but states she did not tolerate it and would not start therapy again. She did not call her office to let us know, and per Ashley Ashley Conley most recent note earlier this month we assumed pt was still taking her Plavix.   Current Medications: Zetia 10mg  daily Intolerances: statins - elevated LFTs/ ? Liver failure, Praluent - difficulty using pre-filled pens Risk Factors: CAD s/p NSTEMI and PCI  LDL  goal: 70mg /dL  Diet: Tries to be aware of her diet. Pre-diabetic and watches her sugar and carbohydrates. Oatmeal or 1 piece of toast in the morning. Mango and yogurt for lunch. Likes vegetables and chicken or pork in the evening. Lactose intolerant.  Exercise: Uses her treadmill every other day for 1/2 hour. Walks her dog twice a day. 9,000-11,000 steps per day.  Family History: Pt has an identical twin sister who lives in [place].  No cardiac events but diabetic.  Son has elevated cholesterol controlled with diet.   Social History: Denies tobacco, alcohol, and illicit drug use.  Labs: 12/24/14: TC 237, TG 214, HDL 44, LDL 150, AST 52, ALT 76  Past Medical History:  Diagnosis Date  . CAD (coronary artery disease)    a. a. S/p DES to LCX 12/2012. b. 02/2015: s/p successful but difficult PTCA to dLAD-1 c/b subintimal dissection requiring balloon inflations/DES, and PTCA to dLAD-2.  Marland Kitchen Cancer of right breast ([place]) 1992  . Diverticulitis    a. bowel resection in 10/2013.  Marland Kitchen Hyperglycemia   . Hyperlipidemia    a. Statins caused to elevated LFTs.  . Hypertension   . NSTEMI (non-ST elevated myocardial infarction) ([place]) 12/2012    Current Outpatient Medications on File Prior to Visit  Medication Sig Dispense Refill  . aspirin 81 MG chewable tablet Chew 81 mg by mouth daily.     . clopidogrel (PLAVIX) 75 MG tablet Take 1 tablet (75 mg total) by mouth daily. 90 tablet 3  . Coenzyme Q10 (COQ-10) 200 MG CAPS Take 200 mg by mouth every evening.     . ezetimibe (ZETIA) 10 MG tablet Take 1 tablet (10 mg total) by mouth daily. 90 tablet 3  . isosorbide mononitrate (IMDUR) 30 MG 24 hr tablet Take 0.5 tablets (15 mg total) by mouth daily. 45 tablet 3  . metoprolol tartrate (LOPRESSOR) 25 MG tablet Take 1 tablet (25 mg total) by mouth 2 (two) times daily. Please keep upcoming appt. 180 tablet 3  . Multiple Vitamin (MULTIVITAMIN) tablet Take 2 tablets by mouth daily.    . nitroGLYCERIN (NITROSTAT) 0.4  MG SL tablet Place 1 tablet (0.4 mg total) under the tongue every 5 (five) minutes as needed for chest pain. 25 tablet 5   No current facility-administered medications on file prior to visit.     Allergies  Allergen Reactions  . Statins Other (See Comments)    Liver failure from previously taking  . Sulfa Antibiotics Nausea And Vomiting  . Dairy Aid [Lactase] Other (See Comments)    Lactose intolerant   . Advil [Ibuprofen] Itching    Assessment/Plan:  1. Hyperlipidemia - Will check baseline lipids today since they have not been checked since 2016. She is adherent to her Zetia 10mg  daily and cannot take statins due to statin-related liver failure. She screen-failed for the CLEAR study and previously took Praluent until she switched to Medicare and injections became too expensive. Will submit prior authorization for Repatha to see if cost is lower.  2. Medication discrepancy - Pt is supposed to be on DAPT for life, however mentioned today that she stopped taking her Plavix 6 weeks after it was prescribed in 02/2016 due to a side effect she cannot recall. Clinic was not made aware of this and we have been under the impression that she has been taking her Plavix. She states she does not want to restart therapy due to side effect that she can't remember. She previously experienced dyspnea on Brilinta and Effient was too expensive. Will route to Ashley Conley and Ashley Conley for follow up regarding antiplatelet therapy. Effient may be more affordable now since it has gone generic.    Ashley Conley, PharmD, CPP, [facility] [address] [place], [place] [address] Phone: [phone]; Fax: [phone] 05/01/2017 11:00 AM

## 2017-05-03 ENCOUNTER — Telehealth: Payer: Self-pay | Admitting: Pharmacist

## 2017-05-03 DIAGNOSIS — E782 Mixed hyperlipidemia: Secondary | ICD-10-CM

## 2017-05-03 MED ORDER — EVOLOCUMAB 140 MG/ML ~~LOC~~ SOAJ: 1.0000 "pen " | SUBCUTANEOUS | 11 refills | Status: DC

## 2017-05-03 NOTE — Telephone Encounter (Signed)
Prior authorization for Repatha has been approved. Rx sent to specialty pharmacy to see if copay is affordable. 

## 2017-05-04 NOTE — Telephone Encounter (Signed)
Copay cost prohibitive at $430 per month ($415 towards her deductible). Pt would like to pursue patient assistance. Will submit application through [employer].

## 2017-06-14 NOTE — Telephone Encounter (Addendum)
Pt approved for Repatha patient assistance. Pt has successfully self administered 1 injection so far. Scheduled f/u lab work after 3 injections to assess efficacy.

## 2017-06-14 NOTE — Addendum Note (Signed)
Addended by: Evelean Bigler E on: 06/14/2017 03:49 PM   Modules accepted: Orders

## 2017-07-03 ENCOUNTER — Other Ambulatory Visit: Payer: Medicare Other

## 2017-07-03 DIAGNOSIS — E782 Mixed hyperlipidemia: Secondary | ICD-10-CM

## 2017-07-03 LAB — LIPID PANEL
CHOL/HDL RATIO: 2.5 ratio (ref 0.0–4.4)
CHOLESTEROL TOTAL: 111 mg/dL (ref 100–199)
HDL: 45 mg/dL (ref >39)
LDL CALC: 31 mg/dL (ref 0–99)
Triglycerides: 173 mg/dL — ABNORMAL HIGH (ref 0–149)
VLDL CHOLESTEROL CAL: 35 mg/dL (ref 5–40)

## 2017-07-03 LAB — HEPATIC FUNCTION PANEL
ALBUMIN: 4.4 g/dL (ref 3.6–4.8)
ALT: 68 IU/L — ABNORMAL HIGH (ref 0–32)
AST: 45 IU/L — AB (ref 0–40)
Alkaline Phosphatase: 73 IU/L (ref 39–117)
BILIRUBIN TOTAL: 0.3 mg/dL (ref 0.0–1.2)
Bilirubin, Direct: 0.12 mg/dL (ref 0.00–0.40)
TOTAL PROTEIN: 6.9 g/dL (ref 6.0–8.5)

## 2017-10-10 NOTE — Progress Notes (Signed)
Cardiology Office Note    Date:  10/17/2017   ID:  Ashley Conley, DOB 02/23/1950, MRN [id number]  PCP:  Imagene Riches, NP  Cardiologist: Dr. Johnsie Cancel  CC: follow up- 6 month.   History of Present Illness:   67 y.o. with remote Breast CA, diverticulitis with bowel resection Multiple interventions for CAD. 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 CAD (coronary artery disease)    a. a. S/p DES to LCX 12/2012. b. 02/2015: s/p successful but difficult PTCA to dLAD-1 c/b subintimal dissection requiring balloon inflations/DES, and PTCA to dLAD-2.  Marland Kitchen Cancer of right breast ([place]) 1992  . Diverticulitis    a. bowel resection in 10/2013.  Marland Kitchen Hyperglycemia   . Hyperlipidemia    a. Statins caused to elevated LFTs.  . Hypertension   . NSTEMI (non-ST elevated myocardial infarction) ([place]) 12/2012    Past Surgical History:  Procedure Laterality Date  . BREAST BIOPSY Right 1990  . BREAST RECONSTRUCTION Right 1990   w/implant  . CARDIAC CATHETERIZATION N/A 03/10/2015   Procedure: Left Heart Cath and Coronary Angiography;  Surgeon: Troy Sine, MD;  Location: [facility];  Service: Cardiovascular;  Laterality: N/A;  . COLECTOMY  2015   "for diverticulitis"  . CORONARY ANGIOPLASTY WITH STENT PLACEMENT  01/12/2013  . LEFT HEART CATH N/A 01/12/2013   Procedure: LEFT HEART CATH;  Surgeon: Wellington Hampshire, MD;  Location: [facility] CATH LAB;  Service: Cardiovascular;  Laterality: N/A;  . LIVER  BIOPSY  2000s   Performed for abnormal LFTs in the setting of statin use  . MASTECTOMY Bilateral 1992  . PERCUTANEOUS CORONARY STENT INTERVENTION (PCI-S)  01/12/2013   Procedure: PERCUTANEOUS CORONARY STENT INTERVENTION (PCI-S);  Surgeon: Wellington Hampshire, MD;  Location: [facility] CATH LAB;  Service: Cardiovascular;;  Mid CX DES  . PROCTOSCOPY N/A 10/27/2013   Procedure: RIGID PROCTOSCOPY;  Surgeon: Leighton Ruff, MD;  Location: [facility] ORS;  Service: General;  Laterality: N/A;  . RECONSTRUCTION BREAST W/ TRAM FLAP Bilateral 1992  . TONSILLECTOMY  1950s  . TOTAL ABDOMINAL HYSTERECTOMY  2005    Current Medications: Outpatient Medications Prior to Visit  Medication Sig Dispense Refill  . aspirin 81 MG chewable tablet Chew 81 mg by mouth daily.     . Coenzyme Q10 (COQ-10) 100 MG capsule Take 100 mg by mouth every evening.     . Evolocumab (REPATHA SURECLICK) 628 MG/ML SOAJ Inject 1 pen into the skin every 14 (fourteen) days. 2 pen 11  . ezetimibe (ZETIA) 10 MG tablet Take 1 tablet (10 mg total) by mouth daily. 90 tablet 3  . isosorbide mononitrate (IMDUR) 30 MG 24 hr tablet Take 0.5 tablets (15 mg total) by mouth daily. 45 tablet 3  . metoprolol tartrate (LOPRESSOR) 25 MG tablet Take 1 tablet (25 mg total) by mouth 2 (two) times daily. Please keep upcoming appt. 180 tablet 3  . Multiple Vitamin (MULTIVITAMIN) tablet Take 1  tablet by mouth daily.     . nitroGLYCERIN (NITROSTAT) 0.4 MG SL tablet Place 1 tablet (0.4 mg total) under the tongue every 5 (five) minutes as needed for chest pain. 25 tablet 5  . clopidogrel (PLAVIX) 75 MG tablet Take 1 tablet (75 mg total) by mouth daily. (Patient not taking: Reported on 10/17/2017) 90 tablet 3   No facility-administered medications prior to visit.      Allergies:   Statins; Sulfa antibiotics; Dairy aid [lactase]; and Advil [ibuprofen]   Social History   Socioeconomic History  . Marital status: Married    Spouse name: Not on file  . Number of children: Not on  file  . Years of education: Not on file  . Highest education level: Not on file  Occupational History  . Not on file  Social Needs  . Financial resource strain: Not on file  . Food insecurity:    Worry: Not on file    Inability: Not on file  . Transportation needs:    Medical: Not on file    Non-medical: Not on file  Tobacco Use  . Smoking status: Never Smoker  . Smokeless tobacco: Never Used  Substance and Sexual Activity  . Alcohol use: No  . Drug use: No  . Sexual activity: Yes    Birth control/protection: Post-menopausal  Lifestyle  . Physical activity:    Days per week: Not on file    Minutes per session: Not on file  . Stress: Not on file  Relationships  . Social connections:    Talks on phone: Not on file    Gets together: Not on file    Attends religious service: Not on file    Active member of club or organization: Not on file    Attends meetings of clubs or organizations: Not on file    Relationship status: Not on file  Other Topics Concern  . Not on file  Social History Narrative  . Not on file     Family History:  The patient's family history includes Healthy in her mother; Heart attack in her father, maternal grandfather, and paternal grandmother; Heart disease in her father.      ROS:   Please see the history of present illness.    ROS All other systems reviewed and are negative.   PHYSICAL EXAM:   VS:  BP 108/78   Pulse 61   Ht 5\' 2"  (1.575 m)   Wt 148 lb 8 oz (67.4 kg)   SpO2 95%   BMI 27.16 kg/m    Affect appropriate Healthy:  appears stated age 55: normal Neck supple with no adenopathy JVP normal no bruits no thyromegaly Lungs clear with no wheezing and good diaphragmatic motion Heart:  S1/S2 no murmur, no rub, gallop or click PMI normal Abdomen: benighn, BS positve, no tenderness, no AAA no bruit.  No HSM or HJR Distal pulses intact with no bruits No edema Neuro non-focal Skin warm and dry No muscular weakness    Wt  Readings from Last 3 Encounters:  10/17/17 148 lb 8 oz (67.4 kg)  04/13/17 148 lb (67.1 kg)  02/23/16 156 lb (70.8 kg)      Studies/Labs Reviewed:   EKG:  EKG is ordered today.  The ekg ordered today demonstrates NSR with sinus arrhythmia  HR 68  04/13/17 SR rate 54 normal   Recent Labs: 07/03/2017: ALT 68   Lipid Panel    Component Value Date/Time   CHOL 111 07/03/2017 0958  TRIG 173 (H) 07/03/2017 0958   HDL 45 07/03/2017 0958   CHOLHDL 2.5 07/03/2017 0958   CHOLHDL 6.1 03/10/2015 0324   VLDL 53 (H) 03/10/2015 0324   LDLCALC 31 07/03/2017 0958   LDLDIRECT 151 (H) 05/01/2017 1057    Additional studies/ records that were reviewed today include:  03/10/15 Left Heart Cath and Coronary Angiography  Conclusion   Prox RCA lesion, 30% stenosed.  Mid RCA to Dist RCA lesion, 40% stenosed.  Dist LAD-1 lesion, 85% stenosed. Post intervention, there is a 0% residual stenosis. The lesion was not previously treated.  Dist LAD-2 lesion, 100% stenosed. Post intervention, there is a 0% residual stenosis.  The left ventricular systolic function is normal.   Normal LV function.  Progressive native coronary obstructive disease with eccentric 85% somewhat calcified mid LAD stenosis; widely patent stent in the left circumflex coronary artery; and a dominant RCA with smooth 30% proximal and 30-40% narrowing in the region of the acute margin.  Difficult but ultimately successful percutaneous coronary intervention to the 85% mid LAD stenosis which was complicated by subintimal dissection resulting in no flow phenomena distally, chest pain, and transient ST segment elevation.  Ultimately after numerous balloon inflations, a 2.538 mm Xience Alpine DES stent was inserted in the mid LAD distally and PTCA alone was performed in the apical segment which resulted in restoration of TIMI-3 flow and residual narrowing of 0% at all sites. RECOMMENDATION: Patient should continue dual antiplatelet  therapy, probably indefinitely.     ASSESSMENT & PLAN:   CAD: continue ASA and BB. Dyspnea with Brilinta and pruritis headache with Effient continue plavix   Essential HTN: Well controlled.  Continue current medications and low sodium Dash type diet.    HLD: On Repatha now and LDL at goal   Pre diabetes:Discussed low carb diet.  Target hemoglobin A1c is 6.5 or less.  Continue current medications.   Jenkins Rouge

## 2017-10-17 ENCOUNTER — Ambulatory Visit (INDEPENDENT_AMBULATORY_CARE_PROVIDER_SITE_OTHER): Payer: Medicare Other | Admitting: Cardiovascular Disease

## 2017-10-17 ENCOUNTER — Encounter: Payer: Self-pay | Admitting: Cardiovascular Disease

## 2017-10-17 VITALS — BP 108/78 | HR 61 | Ht 62.0 in | Wt 148.5 lb

## 2017-10-17 DIAGNOSIS — E782 Mixed hyperlipidemia: Secondary | ICD-10-CM | POA: Diagnosis not present

## 2017-10-17 DIAGNOSIS — I2510 Atherosclerotic heart disease of native coronary artery without angina pectoris: Secondary | ICD-10-CM | POA: Diagnosis not present

## 2017-10-17 DIAGNOSIS — I10 Essential (primary) hypertension: Secondary | ICD-10-CM | POA: Diagnosis not present

## 2017-10-17 NOTE — Patient Instructions (Addendum)

## 2017-10-26 ENCOUNTER — Other Ambulatory Visit: Payer: Self-pay | Admitting: Physician Assistant

## 2018-03-06 ENCOUNTER — Telehealth: Payer: Self-pay | Admitting: Cardiovascular Disease

## 2018-03-06 NOTE — Telephone Encounter (Signed)
Patient's spouse called was trying to fax to [phone] was having trouble sending it.  Gave him general fax number of [phone] forms went through. It was forms for Repatha.

## 2018-03-06 NOTE — Telephone Encounter (Signed)
Found them, thank you! Spoke with husband. We will submit new PA since its new insurance and then send application to SNF when we have an approval letter

## 2018-03-12 NOTE — Telephone Encounter (Signed)
Safety Net Application was denied. Spoke with pt - copay is cost prohibitive with $435 deductible and $175 monthly copay after that. Provided her with [place] # to see if they will approve her for any patient assistance. Otherwise, will plan to start pt on bempedoic acid once FDA approved next month.

## 2018-03-20 ENCOUNTER — Other Ambulatory Visit: Payer: Self-pay

## 2018-03-20 MED ORDER — METOPROLOL TARTRATE 25 MG PO TABS: 25.0000 mg | ORAL_TABLET | Freq: Two times a day (BID) | ORAL | 0 refills | Status: DC

## 2018-03-20 MED ORDER — ISOSORBIDE MONONITRATE ER 30 MG PO TB24: 15.0000 mg | ORAL_TABLET | Freq: Every day | ORAL | 0 refills | Status: DC

## 2018-03-20 MED ORDER — EZETIMIBE 10 MG PO TABS: 10.0000 mg | ORAL_TABLET | Freq: Every day | ORAL | 0 refills | Status: DC

## 2018-03-21 ENCOUNTER — Other Ambulatory Visit: Payer: Self-pay | Admitting: *Deleted

## 2018-03-21 ENCOUNTER — Telehealth: Payer: Self-pay | Admitting: Cardiovascular Disease

## 2018-03-21 MED ORDER — ISOSORBIDE MONONITRATE ER 30 MG PO TB24: 15.0000 mg | ORAL_TABLET | Freq: Every day | ORAL | 3 refills | Status: DC

## 2018-03-21 NOTE — Telephone Encounter (Signed)
Called patient's pharmacy and informed them that splitting imdur in half is fine.

## 2018-03-21 NOTE — Telephone Encounter (Signed)
New Message    Patient states the medication imdur is not recommended for splitting by manufacturer and wants to know if the doctor wants to proceed with the order.

## 2018-05-21 ENCOUNTER — Other Ambulatory Visit: Payer: Self-pay | Admitting: Cardiovascular Disease

## 2018-06-06 ENCOUNTER — Telehealth: Payer: Self-pay | Admitting: Pharmacist

## 2018-06-06 NOTE — Telephone Encounter (Signed)
Followed up with pt regarding lipids. She was denied Repatha assistance from [employer] and [employer]. Discussed Nexletol as well, however she would still need to pay through $435 deductible like she would for Repatha. She declines both options but will look into different plans for next year. Will plan to reach out to pt in January 2021, recommended that she look for a plan with a lower deductible as both Repatha and Nexletol are Tier 3 on most insurance plans.

## 2018-12-03 NOTE — Progress Notes (Signed)
Cardiology Office Note    Date:  12/06/2018   ID:  Ashley Conley, DOB 02-Oct-1950, MRN [id number]  PCP:  Imagene Riches, NP  Cardiologist: Dr. Johnsie Cancel  CC: follow up- 6 month.   History of Present Illness:   68 y.o. with remote Breast CA, diverticulitis with bowel resection Multiple interventions for CAD. 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 CAD (coronary artery disease)    a. a. S/p DES to LCX 12/2012. b. 02/2015: s/p successful but difficult PTCA to dLAD-1 c/b subintimal dissection requiring balloon inflations/DES, and PTCA to dLAD-2.  Marland Kitchen Cancer of right breast ([place]) 1992  . Diverticulitis    a. bowel resection in 10/2013.  Marland Kitchen Hyperglycemia   . Hyperlipidemia    a. Statins caused to elevated LFTs.  . Hypertension   . NSTEMI (non-ST elevated myocardial infarction) ([place]) 12/2012    Past Surgical History:  Procedure Laterality Date  . BREAST BIOPSY Right 1990  . BREAST RECONSTRUCTION Right 1990   w/implant  . CARDIAC CATHETERIZATION N/A 03/10/2015   Procedure: Left Heart Cath and Coronary Angiography;  Surgeon: Troy Sine, MD;  Location: [place] [facility];  Service: Cardiovascular;  Laterality: N/A;  . COLECTOMY  2015   "for diverticulitis"  . CORONARY ANGIOPLASTY WITH STENT PLACEMENT  01/12/2013  . LEFT HEART CATH N/A 01/12/2013   Procedure: LEFT HEART CATH;  Surgeon: Wellington Hampshire, MD;  Location: [facility] CATH LAB;  Service: Cardiovascular;  Laterality: N/A;  . LIVER BIOPSY  2000s   Performed for abnormal LFTs in the setting of statin use  . MASTECTOMY Bilateral 1992  . PERCUTANEOUS CORONARY STENT INTERVENTION (PCI-S)  01/12/2013   Procedure: PERCUTANEOUS CORONARY STENT INTERVENTION (PCI-S);  Surgeon: Wellington Hampshire, MD;  Location: [facility] CATH LAB;  Service: Cardiovascular;;  Mid CX DES  . PROCTOSCOPY N/A 10/27/2013   Procedure: RIGID PROCTOSCOPY;  Surgeon: Leighton Ruff, MD;  Location: [facility];  Service: General;  Laterality: N/A;  . RECONSTRUCTION BREAST W/ TRAM FLAP Bilateral 1992  . TONSILLECTOMY  1950s  . TOTAL ABDOMINAL HYSTERECTOMY  2005    Current Medications: Outpatient Medications Prior to Visit  Medication Sig Dispense Refill  . aspirin 81 MG chewable tablet Chew 81 mg by mouth daily.     . Coenzyme Q10 (COQ-10) 100 MG capsule Take 100 mg by mouth every evening.     . ezetimibe (ZETIA) 10 MG tablet TAKE 1 TABLET BY MOUTH  DAILY 90 tablet 2  . isosorbide mononitrate (IMDUR) 30 MG 24 hr tablet Take 0.5 tablets (15 mg total) by mouth daily. 45 tablet 3  . metoprolol tartrate (LOPRESSOR) 25 MG tablet TAKE 1 TABLET BY MOUTH  TWICE A DAY 180 tablet 2  . Multiple Vitamin (MULTIVITAMIN) tablet Take 1 tablet by mouth daily.     . nitroGLYCERIN (NITROSTAT) 0.4  MG SL tablet DISSOLVE ONE TABLET UNDER THE TONGUE EVERY 5 MINUTES AS NEEDED FOR CHEST PAIN.  DO NOT EXCEED A TOTAL OF 3 DOSES IN 15 MINUTES 25 tablet 3   No facility-administered medications prior to visit.      Allergies:   Statins, Sulfa antibiotics, Dairy aid [lactase], and Advil [ibuprofen]   Social History   Socioeconomic History  . Marital status: Married    Spouse name: Not on file  . Number of children: Not on file  . Years of education: Not on file  . Highest education level: Not on file  Occupational History  . Not on file  Social Needs  . Financial resource strain: Not on file  . Food  insecurity    Worry: Not on file    Inability: Not on file  . Transportation needs    Medical: Not on file    Non-medical: Not on file  Tobacco Use  . Smoking status: Never Smoker  . Smokeless tobacco: Never Used  Substance and Sexual Activity  . Alcohol use: No  . Drug use: No  . Sexual activity: Yes    Birth control/protection: Post-menopausal  Lifestyle  . Physical activity    Days per week: Not on file    Minutes per session: Not on file  . Stress: Not on file  Relationships  . Social Herbalist on phone: Not on file    Gets together: Not on file    Attends religious service: Not on file    Active member of club or organization: Not on file    Attends meetings of clubs or organizations: Not on file    Relationship status: Not on file  Other Topics Concern  . Not on file  Social History Narrative  . Not on file     Family History:  The patient's family history includes Healthy in her mother; Heart attack in her father, maternal grandfather, and paternal grandmother; Heart disease in her father.      ROS:   Please see the history of present illness.    ROS All other systems reviewed and are negative.   PHYSICAL EXAM:   VS:  BP 100/60   Pulse 66   Ht 5\' 2"  (1.575 m)   Wt 144 lb (65.3 kg)   SpO2 97%   BMI 26.34 kg/m    Affect appropriate Healthy:  appears stated age 84: normal Neck supple with no adenopathy JVP normal no bruits no thyromegaly Lungs clear with no wheezing and good diaphragmatic motion Heart:  S1/S2 no murmur, no rub, gallop or click PMI normal Abdomen: benighn, BS positve, no tenderness, no AAA no bruit.  No HSM or HJR Distal pulses intact with no bruits No edema Neuro non-focal Skin warm and dry Tattoo right forearm  No muscular weakness    Wt Readings from Last 3 Encounters:  12/06/18 144 lb (65.3 kg)  10/17/17 148 lb 8 oz (67.4 kg)  04/13/17 148 lb (67.1 kg)      Studies/Labs Reviewed:   EKG:  12/06/18 SR rate  66 normal   Recent Labs: No results found for requested labs within last 8760 hours.   Lipid Panel    Component Value Date/Time   CHOL 111 07/03/2017 0958   TRIG 173 (H) 07/03/2017 0958   HDL 45 07/03/2017 0958   CHOLHDL 2.5 07/03/2017 0958   CHOLHDL 6.1 03/10/2015 0324   VLDL 53 (H) 03/10/2015 0324   LDLCALC 31 07/03/2017 [id number]  LDLDIRECT 151 (H) 05/01/2017 1057    Additional studies/ records that were reviewed today include:  03/10/15 Left Heart Cath and Coronary Angiography  Conclusion   Prox RCA lesion, 30% stenosed.  Mid RCA to Dist RCA lesion, 40% stenosed.  Dist LAD-1 lesion, 85% stenosed. Post intervention, there is a 0% residual stenosis. The lesion was not previously treated.  Dist LAD-2 lesion, 100% stenosed. Post intervention, there is a 0% residual stenosis.  The left ventricular systolic function is normal.   Normal LV function.  Progressive native coronary obstructive disease with eccentric 85% somewhat calcified mid LAD stenosis; widely patent stent in the left circumflex coronary artery; and a dominant RCA with smooth 30% proximal and 30-40% narrowing in the region of the acute margin.  Difficult but ultimately successful percutaneous coronary intervention to the 85% mid LAD stenosis which was complicated by subintimal dissection resulting in no flow phenomena distally, chest pain, and transient ST segment elevation.  Ultimately after numerous balloon inflations, a 2.538 mm Xience Alpine DES stent was inserted in the mid LAD distally and PTCA alone was performed in the apical segment which resulted in restoration of TIMI-3 flow and residual narrowing of 0% at all sites. RECOMMENDATION: Patient should continue dual antiplatelet therapy, probably indefinitely.     ASSESSMENT & PLAN:   CAD: continue ASA and BB. Dyspnea with Brilinta and pruritis headache with Effient continue plavix   Essential HTN: Well controlled.  Continue current medications and low  sodium Dash type diet.    HLD: Unable to afford PSK-9 with current insurance on zetia presently   Pre diabetes:Discussed low carb diet.  Target hemoglobin A1c is 6.5 or less.  Continue current medications.   Jenkins Rouge

## 2018-12-06 ENCOUNTER — Encounter: Payer: Self-pay | Admitting: Cardiovascular Disease

## 2018-12-06 ENCOUNTER — Ambulatory Visit (INDEPENDENT_AMBULATORY_CARE_PROVIDER_SITE_OTHER): Payer: Medicare Other | Admitting: Cardiovascular Disease

## 2018-12-06 ENCOUNTER — Other Ambulatory Visit: Payer: Self-pay

## 2018-12-06 VITALS — BP 100/60 | HR 66 | Ht 62.0 in | Wt 144.0 lb

## 2018-12-06 DIAGNOSIS — I2510 Atherosclerotic heart disease of native coronary artery without angina pectoris: Secondary | ICD-10-CM | POA: Diagnosis not present

## 2018-12-06 NOTE — Patient Instructions (Signed)
Medication Instructions:  *If you need a refill on your cardiac medications before your next appointment, please call your pharmacy*  Lab Work: If you have labs (blood work) drawn today and your tests are completely normal, you will receive your results only by: . MyChart Message (if you have MyChart) OR . A paper copy in the mail If you have any lab test that is abnormal or we need to change your treatment, we will call you to review the results.  Testing/Procedures: None ordered today.  Follow-Up: At [facility], you and your health needs are our priority.  As part of our continuing mission to provide you with exceptional heart care, we have created designated Provider Care Teams.  These Care Teams include your primary Cardiologist (physician) and Advanced Practice Providers (APPs -  Physician Assistants and Nurse Practitioners) who all work together to provide you with the care you need, when you need it.  Your next appointment:   12 month(s)  The format for your next appointment:   In Person  Provider:   You may see Dr. Nishan or one of the following Advanced Practice Providers on your designated Care Team:    Lori Gerhardt, NP  Laura Ingold, NP  Jill McDaniel, NP   

## 2019-01-02 ENCOUNTER — Other Ambulatory Visit: Payer: Self-pay | Admitting: Cardiovascular Disease

## 2019-01-19 ENCOUNTER — Other Ambulatory Visit: Payer: Self-pay | Admitting: Cardiovascular Disease

## 2019-02-17 ENCOUNTER — Telehealth: Payer: Self-pay | Admitting: Pharmacist

## 2019-02-17 NOTE — Telephone Encounter (Addendum)
Called pt to see if she changed insurance plans this year - Repatha and Nexletol previously cost prohibitive due to $435 deductible. [employer].  ID: [id number] BIN: [id number] GRP: [id number]  New prior auth sent for Repatha to see if copay is more affordable this year.

## 2019-02-18 MED ORDER — REPATHA SURECLICK 140 MG/ML ~~LOC~~ SOAJ: 1.0000 "pen " | SUBCUTANEOUS | 11 refills | Status: DC

## 2019-02-18 NOTE — Telephone Encounter (Signed)
Repatha prior auth approved through 08/17/19. Rx sent to pharmacy to determine if copay is affordable.

## 2019-02-18 NOTE — Telephone Encounter (Signed)
Pt returned call to clinic and states her cholesterol numbers have improved. Advised her her LDL is 129 and her goal is < 70 and her TG are also elevated at 327.  She states one of her friends takes Nexletol. Advised her that this will only lower her LDL by 20% and she requires more aggressive lipid lowering therapy. She is ultimately agreeable to Repatha injections. Rx sent to [facility] on [address] in [place] per preference. She states she will go to PCP closer to her home for follow up labs.

## 2019-02-18 NOTE — Addendum Note (Signed)
Addended by: Deidra Spease E on: 02/18/2019 04:20 PM   Modules accepted: Orders

## 2019-02-18 NOTE — Telephone Encounter (Signed)
Called [facility] pharmacy, they stated copay is $480. Pt changed insurance plans to another plan with the same $435 deductible instead of finding one with lower or no deductible. Follow up copays would be $45/month. Pt states she needs to think about it, will also need new rx sent to [facility] in [place] if she wishes to start therapy ([facility] - pt states she doesn't use that pharmacy or other 3 that were on her profile). She doesn't know which [facility] in [place] she uses so she will need to let us know that as well.

## 2019-07-31 ENCOUNTER — Telehealth: Payer: Self-pay | Admitting: Pharmacist

## 2019-07-31 ENCOUNTER — Telehealth: Payer: Self-pay

## 2019-07-31 NOTE — Telephone Encounter (Signed)
Pt called asking questions regarding repatha raising blood sugar and concerns about starting it. Will route to pharmd pool

## 2019-07-31 NOTE — Telephone Encounter (Signed)
Called and spoke w/pt regarding the repatha and they stated that they haven't picked it up. Their pa expires 08/17/19. I instructed them to apply for the [employer] and to pick up their med. Will complete as a new start after 08/17/19.

## 2019-07-31 NOTE — Telephone Encounter (Signed)
Addressed in separate phone note from today.

## 2019-07-31 NOTE — Telephone Encounter (Signed)
Patient called, was concerned about starting Repatha due to her reding on side effects, namely that it could increase her blood sugar.  Reports she is pre diabetic and has made diet changes and most recent A1c had dropped to 5.8.  Advised patient that increasing blood sugar is a possible side effect of Repatha impacting ~8.8% of patients and her other option would be Praluent.  However, advised patient if she continued her diet and exercise changes, the effect on her blood usgar should be minimal.  Patient voiced understanding and is willing to try the [place]

## 2019-07-31 NOTE — Telephone Encounter (Signed)
Follow up    Pt is calling back to speak with [facility] Call transferred to [facility]

## 2019-08-26 ENCOUNTER — Other Ambulatory Visit: Payer: Self-pay

## 2019-08-26 MED ORDER — REPATHA SURECLICK 140 MG/ML ~~LOC~~ SOAJ: 1.0000 "pen " | SUBCUTANEOUS | 11 refills | Status: DC

## 2019-10-08 ENCOUNTER — Telehealth: Payer: Self-pay | Admitting: Cardiovascular Disease

## 2019-10-08 NOTE — Telephone Encounter (Signed)
Returned call to pt, spoke with her husband since pt was out. Reports that pt has injected Repatha 2-3x and didn't have an issue with the first few shots. She gave herself an injection yesterday around 4pm on her upper inner thigh and this morning noticed some redness, itching, and puffiness around the injection site about the circumference of an egg.  Advised that pt can use ice pack and antihistamine for local injection site reaction. Encouraged her to give next injection on either upper outer thigh or abdomen to see if change in location helps. If injection site reactions continue and are bothersome, can consider trial of Praluent instead (this is latex free, pt does not have latex allergy noted but may be tolerated better).

## 2019-10-08 NOTE — Telephone Encounter (Signed)
Pt c/o medication issue:  1. Name of Medication: Repatha  2. How are you currently taking this medication (dosage and times per day)? Every two weeks  3. Are you having a reaction (difficulty breathing--STAT)? no  4. What is your medication issue?  Stared with 2 bumps around the  area where she had the shot- after walking it was red, the size of an egg, puffy  and itchy. The area is now  Pale and still the size of an egg

## 2019-11-26 ENCOUNTER — Other Ambulatory Visit: Payer: Self-pay | Admitting: Cardiovascular Disease

## 2019-12-16 ENCOUNTER — Other Ambulatory Visit: Payer: Self-pay | Admitting: Cardiovascular Disease

## 2019-12-23 ENCOUNTER — Ambulatory Visit: Payer: Medicare Other | Admitting: Cardiovascular Disease

## 2020-01-03 NOTE — Progress Notes (Signed)
Cardiology Office Note    Date:  01/14/2020   ID:  Ashley Conley, DOB Dec 20, 1950, MRN [id number]  PCP:  Imagene Riches, NP  Cardiologist: Dr. Johnsie Cancel  CC: follow up- 6 month.   History of Present Illness:   69 y.o. with remote Breast CA, diverticulitis with bowel resection Multiple interventions for CAD. Last cath 03/10/15 With patent stent in circumflex no significant disease in RCA and difficult intervention to tight mid LAD Had subintimal dissection with no reflow ultimately got TIM I-3 flow     Has had liver failure with statins. 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 CAD (coronary artery disease)    a. a. S/p DES to LCX 12/2012. b. 02/2015: s/p successful but difficult PTCA to dLAD-1 c/b subintimal dissection requiring balloon inflations/DES, and PTCA to dLAD-2.  Marland Kitchen Cancer of right breast ([place]) 1992  . Diverticulitis    a. bowel resection in 10/2013.  Marland Kitchen Hyperglycemia   . Hyperlipidemia    a. Statins caused to elevated LFTs.  . Hypertension   . NSTEMI (non-ST elevated myocardial infarction) ([place]) 12/2012    Past Surgical History:  Procedure Laterality Date  . BREAST BIOPSY Right 1990  . BREAST RECONSTRUCTION Right 1990   w/implant  . CARDIAC CATHETERIZATION N/A 03/10/2015   Procedure: Left Heart Cath and Coronary Angiography;  Surgeon: Troy Sine, MD;  Location: [place] [facility];  Service: Cardiovascular;  Laterality: N/A;  . COLECTOMY  2015   "for diverticulitis"  . CORONARY ANGIOPLASTY  WITH STENT PLACEMENT  01/12/2013  . LEFT HEART CATH N/A 01/12/2013   Procedure: LEFT HEART CATH;  Surgeon: Wellington Hampshire, MD;  Location: [facility] CATH LAB;  Service: Cardiovascular;  Laterality: N/A;  . LIVER BIOPSY  2000s   Performed for abnormal LFTs in the setting of statin use  . MASTECTOMY Bilateral 1992  . PERCUTANEOUS CORONARY STENT INTERVENTION (PCI-S)  01/12/2013   Procedure: PERCUTANEOUS CORONARY STENT INTERVENTION (PCI-S);  Surgeon: Wellington Hampshire, MD;  Location: [facility] CATH LAB;  Service: Cardiovascular;;  Mid CX DES  . PROCTOSCOPY N/A 10/27/2013   Procedure: RIGID PROCTOSCOPY;  Surgeon: Leighton Ruff, MD;  Location: [facility];  Service: General;  Laterality: N/A;  . RECONSTRUCTION BREAST W/ TRAM FLAP Bilateral 1992  . TONSILLECTOMY  1950s  . TOTAL ABDOMINAL HYSTERECTOMY  2005    Current Medications: Outpatient Medications Prior to Visit  Medication Sig Dispense Refill  . aspirin 81 MG chewable tablet Chew 81 mg by mouth daily.     . Coenzyme Q10 (COQ-10) 100 MG capsule Take 100 mg by mouth every evening.     . Evolocumab (REPATHA SURECLICK) 220 MG/ML SOAJ Inject 1 pen into the skin every 14 (fourteen) days. 2 pen 11  . ezetimibe (ZETIA) 10 MG tablet TAKE 1 TABLET BY MOUTH  DAILY 90 tablet 0  . isosorbide mononitrate (IMDUR) 30 MG 24 hr tablet TAKE ONE-HALF TABLET BY  MOUTH DAILY 45 tablet 0  .  metoprolol tartrate (LOPRESSOR) 25 MG tablet TAKE 1 TABLET BY MOUTH  TWICE DAILY 60 tablet 0  . Multiple Vitamin (MULTIVITAMIN) tablet Take 1 tablet by mouth daily.     . nitroGLYCERIN (NITROSTAT) 0.4 MG SL tablet DISSOLVE ONE TABLET UNDER THE TONGUE EVERY 5 MINUTES AS NEEDED FOR CHEST PAIN.  DO NOT EXCEED A TOTAL OF 3 DOSES IN 15 MINUTES 25 tablet 3   No facility-administered medications prior to visit.     Allergies:   Statins, Sulfa antibiotics, Dairy aid [lactase], and Advil [ibuprofen]   Social History   Socioeconomic History  . Marital status: Married    Spouse name: Not on file    . Number of children: Not on file  . Years of education: Not on file  . Highest education level: Not on file  Occupational History  . Not on file  Tobacco Use  . Smoking status: Never Smoker  . Smokeless tobacco: Never Used  Substance and Sexual Activity  . Alcohol use: No  . Drug use: No  . Sexual activity: Yes    Birth control/protection: Post-menopausal  Other Topics Concern  . Not on file  Social History Narrative  . Not on file   Social Determinants of Health   Financial Resource Strain:   . Difficulty of Paying Living Expenses: Not on file  Food Insecurity:   . Worried About Charity fundraiser in the Last Year: Not on file  . Ran Out of Food in the Last Year: Not on file  Transportation Needs:   . Lack of Transportation (Medical): Not on file  . Lack of Transportation (Non-Medical): Not on file  Physical Activity:   . Days of Exercise per Week: Not on file  . Minutes of Exercise per Session: Not on file  Stress:   . Feeling of Stress : Not on file  Social Connections:   . Frequency of Communication with Friends and Family: Not on file  . Frequency of Social Gatherings with Friends and Family: Not on file  . Attends Religious Services: Not on file  . Active Member of Clubs or Organizations: Not on file  . Attends Archivist Meetings: Not on file  . Marital Status: Not on file     Family History:  The patient's family history includes Healthy in her mother; Heart attack in her father, maternal grandfather, and paternal grandmother; Heart disease in her father.      ROS:   Please see the history of present illness.    ROS All other systems reviewed and are negative.   PHYSICAL EXAM:   VS:  BP 120/72   Pulse 62   Ht 5\' 2"  (1.575 m)   Wt 66.2 kg   SpO2 97%   BMI 26.70 kg/m    Affect appropriate Healthy:  appears stated age 64: normal Neck supple with no adenopathy JVP normal no bruits no thyromegaly Lungs clear with no wheezing and good  diaphragmatic motion Heart:  S1/S2 no murmur, no rub, gallop or click PMI normal Abdomen: benighn, BS positve, no tenderness, no AAA no bruit.  No HSM or HJR Distal pulses intact with no bruits No edema Neuro non-focal Skin warm and dry Tattoo right forearm  No muscular weakness    Wt Readings from Last 3 Encounters:  01/14/20 66.2 kg  12/06/18 65.3 kg  10/17/17 67.4 kg      Studies/Labs Reviewed:   EKG:  12/06/18 SR rate 66 normal 01/14/2020 SR rate 62 normal  Recent Labs: No results found for requested labs within last 8760 hours.   Lipid Panel    Component Value Date/Time   CHOL 111 07/03/2017 0958   TRIG 173 (H) 07/03/2017 0958   HDL 45 07/03/2017 0958   CHOLHDL 2.5 07/03/2017 0958   CHOLHDL 6.1 03/10/2015 0324   VLDL 53 (H) 03/10/2015 0324   LDLCALC 31 07/03/2017 0958   LDLDIRECT 151 (H) 05/01/2017 1057    Additional studies/ records that were reviewed today include:  03/10/15 Left Heart Cath and Coronary Angiography  Conclusion   Prox RCA lesion, 30% stenosed.  Mid RCA to Dist RCA lesion, 40% stenosed.  Dist LAD-1 lesion, 85% stenosed. Post intervention, there is a 0% residual stenosis. The lesion was not previously treated.  Dist LAD-2 lesion, 100% stenosed. Post intervention, there is a 0% residual stenosis.  The left ventricular systolic function is normal.   Normal LV function.  Progressive native coronary obstructive disease with eccentric 85% somewhat calcified mid LAD stenosis; widely patent stent in the left circumflex coronary artery; and a dominant RCA with smooth 30% proximal and 30-40% narrowing in the region of the acute margin.  Difficult but ultimately successful percutaneous coronary intervention to the 85% mid LAD stenosis which was complicated by subintimal dissection resulting in no flow phenomena distally, chest pain, and transient ST segment elevation.  Ultimately after numerous balloon inflations, a 2.538 mm Xience Alpine DES  stent was inserted in the mid LAD distally and PTCA alone was performed in the apical segment which resulted in restoration of TIMI-3 flow and residual narrowing of 0% at all sites. RECOMMENDATION: Patient should continue dual antiplatelet therapy, probably indefinitely.     ASSESSMENT & PLAN:   CAD: continue ASA and BB. Dyspnea with Brilinta and pruritis headache with Effient continue plavix   Essential HTN: Well controlled.  Continue current medications and low sodium Dash type diet.    HLD: Back on Repatha with Zetia seeing lipid clinic today to get help with injection technique  Pre diabetes:Discussed low carb diet.  Target hemoglobin A1c is 6.5 or less.  Continue current medications.   Jenkins Rouge

## 2020-01-07 ENCOUNTER — Telehealth: Payer: Self-pay | Admitting: Pharmacist

## 2020-01-07 NOTE — Telephone Encounter (Signed)
Spoke with pt - she had labs drawn in [place] on 11/3 (scanned in under lab tab). LDL was excellent in the 30s, TG elevated in the 300s. Pt is tolerating Repatha and Zetia well and will continue on both. She does have some questions about where the best place is to inject her Repatha, I discussed this with her on the phone but at her visit with you next week she'd like a pharmacist to come down and show her in person too. Looks like there's a message on the appt as a reminder for this. I did mention to her potentially starting either fish oil or fenofibrate to help lower her TG since her diet isn't very high in carbs/sugar/alcohol. She is agreeable to this but wished to talk to you about it at your visit in person next week.  Thanks, Deania Siguenza ===View-only below this line=== ----- Message ----- From: Josue Hector, MD Sent: 01/03/2020   4:33 PM EST To: [place], RPH-CPP, *  Can we get lipid and liver on her before next visit if she is still on Repatha and zetia

## 2020-01-14 ENCOUNTER — Encounter: Payer: Self-pay | Admitting: Cardiovascular Disease

## 2020-01-14 ENCOUNTER — Other Ambulatory Visit: Payer: Self-pay

## 2020-01-14 ENCOUNTER — Ambulatory Visit (INDEPENDENT_AMBULATORY_CARE_PROVIDER_SITE_OTHER): Payer: Medicare Other | Admitting: Cardiovascular Disease

## 2020-01-14 VITALS — BP 120/72 | HR 62 | Ht 62.0 in | Wt 146.0 lb

## 2020-01-14 DIAGNOSIS — E782 Mixed hyperlipidemia: Secondary | ICD-10-CM

## 2020-01-14 DIAGNOSIS — I2510 Atherosclerotic heart disease of native coronary artery without angina pectoris: Secondary | ICD-10-CM | POA: Diagnosis not present

## 2020-01-14 NOTE — Patient Instructions (Signed)
Medication Instructions:  *If you need a refill on your cardiac medications before your next appointment, please call your pharmacy*  Lab Work: If you have labs (blood work) drawn today and your tests are completely normal, you will receive your results only by: Marland Kitchen MyChart Message (if you have MyChart) OR . A paper copy in the mail If you have any lab test that is abnormal or we need to change your treatment, we will call you to review the results.  Testing/Procedures: None ordered today.   Follow-Up: At [facility], you and your health needs are our priority.  As part of our continuing mission to provide you with exceptional heart care, we have created designated Provider Care Teams.  These Care Teams include your primary Cardiologist (physician) and Advanced Practice Providers (APPs -  Physician Assistants and Nurse Practitioners) who all work together to provide you with the care you need, when you need it.  We recommend signing up for the patient portal called "MyChart".  Sign up information is provided on this After Visit Summary.  MyChart is used to connect with patients for Virtual Visits (Telemedicine).  Patients are able to view lab/test results, encounter notes, upcoming appointments, etc.  Non-urgent messages can be sent to your provider as well.   To learn more about what you can do with MyChart, go to [email or web address].    Your next appointment:   6 month(s)  The format for your next appointment:   In Person  Provider:   You may see Dr. Johnsie Cancel or one of the following Advanced Practice Providers on your designated Care Team:    Truitt Merle, NP  Cecilie Kicks, NP  Kathyrn Drown, NP

## 2020-01-29 ENCOUNTER — Other Ambulatory Visit: Payer: Self-pay | Admitting: Cardiovascular Disease

## 2020-02-19 ENCOUNTER — Other Ambulatory Visit: Payer: Self-pay | Admitting: Cardiovascular Disease

## 2020-05-05 ENCOUNTER — Telehealth: Payer: Self-pay

## 2020-05-05 MED ORDER — PRALUENT 150 MG/ML ~~LOC~~ SOAJ: 150.0000 mg | SUBCUTANEOUS | 11 refills | Status: DC

## 2020-05-05 NOTE — Addendum Note (Signed)
Addended by: Allean Found on: 05/05/2020 03:24 PM   Modules accepted: Orders

## 2020-05-05 NOTE — Telephone Encounter (Signed)
Called and spoke w/pt regarding the approval of the praluent, rx sent, pt voiced understanding

## 2020-05-05 NOTE — Telephone Encounter (Signed)
-----   Message from Leeroy Bock, [place] sent at 05/05/2020 11:34 AM EDT ----- Marykay Lex, I got info in the mail on this pt dated 04/09/20 saying that [place] isn't on formulary anymore, would you be able to submit a new PA to take a look? Thank you!

## 2020-05-05 NOTE — Telephone Encounter (Signed)
Called and spoke w/pt regarding the possibility of having to go back to praluent due to insurance change and the pt voiced understanding

## 2020-05-26 ENCOUNTER — Other Ambulatory Visit: Payer: Self-pay

## 2020-05-26 MED ORDER — EZETIMIBE 10 MG PO TABS: 10.0000 mg | ORAL_TABLET | Freq: Every day | ORAL | 2 refills | Status: DC

## 2020-05-26 MED ORDER — METOPROLOL TARTRATE 25 MG PO TABS: 25.0000 mg | ORAL_TABLET | Freq: Two times a day (BID) | ORAL | 2 refills | Status: DC

## 2020-05-26 MED ORDER — NITROGLYCERIN 0.4 MG SL SUBL: SUBLINGUAL_TABLET | SUBLINGUAL | 6 refills | Status: AC

## 2020-05-26 MED ORDER — ISOSORBIDE MONONITRATE ER 30 MG PO TB24: 15.0000 mg | ORAL_TABLET | Freq: Every day | ORAL | 2 refills | Status: DC

## 2020-05-26 NOTE — Telephone Encounter (Signed)
Pt's medications were sent to pt's pharmacy as requested. Confirmation received.  

## 2020-06-10 ENCOUNTER — Telehealth: Payer: Self-pay | Admitting: Cardiovascular Disease

## 2020-06-10 NOTE — Telephone Encounter (Signed)
I spoke with patient about her concerns. I advised that the side effects are very similar to the [place] she was on. I have not had any patients complain of confusion and I do not see that as a side effect on our pharmacy resources. I suggested that Repatha would have the same list of side effect. She states she always had a runny nose with Repatha. She does not want to take the Praluent. She will continue zetia.

## 2020-06-10 NOTE — Telephone Encounter (Signed)
Pt c/o medication issue:  1. Name of Medication:   Alirocumab (PRALUENT) 150 MG/ML SOAJ     2. How are you currently taking this medication (dosage and times per day)? Not currently taking  3. Are you having a reaction (difficulty breathing--STAT)? no  4. What is your medication issue? Patient states she has not started the medication yet. She states she looked it up online and there is a long list of side effects that concern her. Please advise.

## 2020-06-10 NOTE — Telephone Encounter (Addendum)
Will route this message to our Pharmacist, to see if they can further assist the pt with Praluent concerns. They do follow the pt in lipid clinic.

## 2020-07-27 NOTE — Progress Notes (Signed)
Cardiology Office Note    Date:  08/03/2020   ID:  Ashley Conley, DOB 1950/07/05, MRN [id number]  PCP:  Imagene Riches, NP  Cardiologist: Dr. Johnsie Cancel  CC: follow up- 6 month.   History of Present Illness:   70 y.o. with remote Breast CA, diverticulitis with bowel resection Multiple interventions for CAD. Last cath 03/10/15 With patent stent in circumflex no significant disease in RCA and difficult intervention to tight mid LAD Had subintimal dissection with no reflow ultimately got TIM I-3 flow     Has had liver failure with statins. Been on / off Repatha affordability issues 10/08/19 had some issues with erythema of injection site upper inner thigh Also complained of runny noes. 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 a. S/p DES to LCX 12/2012. b. 02/2015: s/p successful but difficult PTCA to dLAD-1 c/b subintimal dissection requiring balloon inflations/DES, and PTCA to dLAD-2.   Cancer of right breast ([place]) 1992   Diverticulitis    a. bowel resection in 10/2013.   Hyperglycemia    Hyperlipidemia    a. Statins caused to elevated LFTs.   Hypertension    NSTEMI (non-ST elevated myocardial infarction) ([place]) 12/2012    Past Surgical History:  Procedure Laterality Date   BREAST BIOPSY Right 1990   BREAST RECONSTRUCTION Right 1990   w/implant   CARDIAC CATHETERIZATION N/A 03/10/2015   Procedure: Left Heart Cath and Coronary Angiography;   Surgeon: Troy Sine, MD;  Location: [place] [facility];  Service: Cardiovascular;  Laterality: N/A;   COLECTOMY  2015   "for diverticulitis"   CORONARY ANGIOPLASTY WITH STENT PLACEMENT  01/12/2013   LEFT HEART CATH N/A 01/12/2013   Procedure: LEFT HEART CATH;  Surgeon: Wellington Hampshire, MD;  Location: [facility];  Service: Cardiovascular;  Laterality: N/A;   LIVER BIOPSY  2000s   Performed for abnormal LFTs in the setting of statin use   MASTECTOMY Bilateral [place] (PCI-S)  01/12/2013   Procedure: PERCUTANEOUS CORONARY STENT INTERVENTION (PCI-S);  Surgeon: Wellington Hampshire, MD;  Location: [facility] C[facility] LAB;  Service: Cardiovascular;;  Mid CX DES   PROCTOSCOPY N/A 10/27/2013   Procedure: RIGID PROCTOSCOPY;  Surgeon: Leighton Ruff, MD;  Location: [facility] ORS;  Service: General;  Laterality: N/A;   RECONSTRUCTION BREAST W/ TRAM FLAP Bilateral 1992   TONSILLECTOMY  1950s   TOTAL ABDOMINAL HYSTERECTOMY  2005    Current Medications: Outpatient Medications Prior to Visit  Medication Sig Dispense Refill   aspirin 81 MG chewable tablet Chew 81 mg by mouth daily.      Coenzyme Q10 (COQ-10) 100 MG capsule Take 100 mg by mouth every evening.      ezetimibe (ZETIA) 10 MG tablet Take 1 tablet (10 mg total) by mouth daily. 90 tablet 2   isosorbide mononitrate (IMDUR)  30 MG 24 hr tablet Take 0.5 tablets (15 mg total) by mouth daily. 45 tablet 2   metoprolol tartrate (LOPRESSOR) 25 MG tablet Take 1 tablet (25 mg total) by mouth 2 (two) times daily. 180 tablet 2   Multiple Vitamin (MULTIVITAMIN) tablet Take 1 tablet by mouth daily.      nitroGLYCERIN (NITROSTAT) 0.4 MG SL tablet DISSOLVE ONE TABLET UNDER THE TONGUE EVERY 5 MINUTES AS NEEDED FOR CHEST PAIN.  DO NOT EXCEED A TOTAL OF 3 DOSES IN 15 MINUTES 25 tablet 6   No facility-administered medications prior to visit.     Allergies:   Statins, Sulfa antibiotics, Dairy aid [lactase], and Advil [ibuprofen]   Social  History   Socioeconomic History   Marital status: Married    Spouse name: Not on file   Number of children: Not on file   Years of education: Not on file   Highest education level: Not on file  Occupational History   Not on file  Tobacco Use   Smoking status: Never   Smokeless tobacco: Never  Substance and Sexual Activity   Alcohol use: No   Drug use: No   Sexual activity: Yes    Birth control/protection: Post-menopausal  Other Topics Concern   Not on file  Social History Narrative   Not on file   Social Determinants of Health   Financial Resource Strain: Not on file  Food Insecurity: Not on file  Transportation Needs: Not on file  Physical Activity: Not on file  Stress: Not on file  Social Connections: Not on file     Family History:  The patient's family history includes Healthy in her mother; Heart attack in her father, maternal grandfather, and paternal grandmother; Heart disease in her father.      ROS:   Please see the history of present illness.    ROS All other systems reviewed and are negative.   PHYSICAL EXAM:   VS:  BP 126/72   Pulse (!) 53   Ht 5\' 2"  (1.575 m)   Wt 67.6 kg   SpO2 98%   BMI 27.25 kg/m    Affect appropriate Healthy:  appears stated age 62: normal Neck supple with no adenopathy JVP normal no bruits no thyromegaly Lungs clear with no wheezing and good diaphragmatic motion Heart:  S1/S2 no murmur, no rub, gallop or click PMI normal Abdomen: benighn, BS positve, no tenderness, no AAA no bruit.  No HSM or HJR Distal pulses intact with no bruits No edema Neuro non-focal Skin warm and dry Tattoo right forearm  No muscular weakness    Wt Readings from Last 3 Encounters:  08/03/20 67.6 kg  01/14/20 66.2 kg  12/06/18 65.3 kg      Studies/Labs Reviewed:   EKG:  12/06/18 SR rate 66 normal 08/03/2020 SR rate 62 normal   Recent Labs: No results found for requested labs within last 8760 hours.   Lipid Panel    Component  Value Date/Time   CHOL 111 07/03/2017 0958   TRIG 173 (H) 07/03/2017 0958   HDL 45 07/03/2017 0958   CHOLHDL 2.5 07/03/2017 0958   CHOLHDL 6.1 03/10/2015 0324   VLDL 53 (H) 03/10/2015 0324   LDLCALC 31 07/03/2017 0958   LDLDIRECT 151 (H) 05/01/2017 1057    Additional studies/ records that were reviewed today include:  03/10/15 Left Heart Cath and Coronary Angiography  Conclusion  Prox RCA lesion, 30% stenosed. Mid RCA to Dist RCA lesion, 40% stenosed. Dist LAD-1 lesion, 85%  stenosed. Post intervention, there is a 0% residual stenosis. The lesion was not previously treated. Dist LAD-2 lesion, 100% stenosed. Post intervention, there is a 0% residual stenosis. The left ventricular systolic function is normal.   Normal LV function.   Progressive native coronary obstructive disease with eccentric 85% somewhat calcified mid LAD stenosis; widely patent stent in the left circumflex coronary artery; and a dominant RCA with smooth 30% proximal and 30-40% narrowing in the region of the acute margin.   Difficult but ultimately successful percutaneous coronary intervention to the 85% mid LAD stenosis which was complicated by subintimal dissection resulting in no flow phenomena distally, chest pain, and transient ST segment elevation.  Ultimately after numerous balloon inflations, a 2.538 mm Xience Alpine DES stent was inserted in the mid LAD distally and PTCA alone was performed in the apical segment which resulted in restoration of TIMI-3 flow and residual narrowing of 0% at all sites. RECOMMENDATION: Patient should continue dual antiplatelet therapy, probably indefinitely.     ASSESSMENT & PLAN:   CAD: continue ASA and BB. Dyspnea with Brilinta and pruritis headache with Effient continue plavix   Essential HTN: Well controlled.  Continue current medications and low sodium Dash type diet.    HLD: on zetia side effects with Repatha and deferred Praluent 12/17/19 but now willing to take it Will  have her f/u with lipid clinic in 3 months   Pre diabetes:Discussed low carb diet.  Target hemoglobin A1c is 6.5 or less.  Continue current medications.  F/U in a year    Jenkins Rouge

## 2020-08-03 ENCOUNTER — Ambulatory Visit (INDEPENDENT_AMBULATORY_CARE_PROVIDER_SITE_OTHER): Payer: Medicare Other | Admitting: Cardiovascular Disease

## 2020-08-03 ENCOUNTER — Other Ambulatory Visit: Payer: Self-pay

## 2020-08-03 ENCOUNTER — Encounter: Payer: Self-pay | Admitting: Cardiovascular Disease

## 2020-08-03 VITALS — BP 126/72 | HR 53 | Ht 62.0 in | Wt 149.0 lb

## 2020-08-03 DIAGNOSIS — E782 Mixed hyperlipidemia: Secondary | ICD-10-CM

## 2020-08-03 DIAGNOSIS — I2510 Atherosclerotic heart disease of native coronary artery without angina pectoris: Secondary | ICD-10-CM | POA: Diagnosis not present

## 2020-08-03 NOTE — Patient Instructions (Signed)
Medication Instructions:  *If you need a refill on your cardiac medications before your next appointment, please call your pharmacy*   Lab Work: If you have labs (blood work) drawn today and your tests are completely normal, you will receive your results only by: Vander (if you have MyChart) OR A paper copy in the mail If you have any lab test that is abnormal or we need to change your treatment, we will call you to review the results.   Follow-Up: At [facility], you and your health needs are our priority.  As part of our continuing mission to provide you with exceptional heart care, we have created designated Provider Care Teams.  These Care Teams include your primary Cardiologist (physician) and Advanced Practice Providers (APPs -  Physician Assistants and Nurse Practitioners) who all work together to provide you with the care you need, when you need it.  We recommend signing up for the patient portal called "MyChart".  Sign up information is provided on this After Visit Summary.  MyChart is used to connect with patients for Virtual Visits (Telemedicine).  Patients are able to view lab/test results, encounter notes, upcoming appointments, etc.  Non-urgent messages can be sent to your provider as well.   To learn more about what you can do with MyChart, go to [email or web address].    Your next appointment:   6 month(s)  The format for your next appointment:   In Person  Provider:   You may see Dr. Johnsie Cancel or one of the following Advanced Practice Providers on your designated Care Team:   Kathyrn Drown, NP  Your physician recommends that you schedule a follow-up appointment in: 3 months with the Lipid Clinic.

## 2020-08-27 ENCOUNTER — Telehealth: Payer: Self-pay | Admitting: Cardiovascular Disease

## 2020-08-27 NOTE — Telephone Encounter (Signed)
Pt is currently taking the Pialuent shot every 2 weeks, pt wants to know if she is still supposed to be taking Ezetimibe 10mg  tablets as well? Please advise pt further

## 2020-08-27 NOTE — Telephone Encounter (Signed)
Yes, if patient is tolerating Zetia recommend she remain on it

## 2020-08-30 ENCOUNTER — Telehealth: Payer: Self-pay | Admitting: Cardiovascular Disease

## 2020-08-30 NOTE — Telephone Encounter (Signed)
Pt would like to know if she should still take the ezetimibe (ZETIA) 10 MG tablet, now that she is taking the Praluent shots.. please advise.

## 2020-08-30 NOTE — Telephone Encounter (Signed)
Left message for patient to call back  

## 2020-08-30 NOTE — Telephone Encounter (Signed)
Patient called back. Informed patient of recommendations from the lipid clinic. Patient verbalized understanding and will continue on the zetia.

## 2020-08-30 NOTE — Telephone Encounter (Signed)
See previous phone note.  

## 2020-11-03 ENCOUNTER — Ambulatory Visit: Payer: Medicare Other

## 2020-11-09 ENCOUNTER — Other Ambulatory Visit: Payer: Self-pay

## 2020-11-09 ENCOUNTER — Ambulatory Visit (INDEPENDENT_AMBULATORY_CARE_PROVIDER_SITE_OTHER): Payer: Medicare Other | Admitting: Pharmacist

## 2020-11-09 DIAGNOSIS — E782 Mixed hyperlipidemia: Secondary | ICD-10-CM

## 2020-11-09 DIAGNOSIS — I25110 Atherosclerotic heart disease of native coronary artery with unstable angina pectoris: Secondary | ICD-10-CM

## 2020-11-09 MED ORDER — PRALUENT 150 MG/ML ~~LOC~~ SOAJ
1.0000 | SUBCUTANEOUS | 11 refills | Status: DC
Start: 2020-11-09 — End: 2021-02-22

## 2020-11-09 NOTE — Patient Instructions (Addendum)
  It was nice to see you today  Your LDL is excellent at 34 and at your goal < 55  Continue on Praluent injections every 2 weeks and ezetimibe (Zetia) daily  I'll see if your insurance covers Leqvio better. It's an injection give at [facility] at month 0 and 3, then every 6 months, and helps to lower your LDL by 50%  Have your primary care doctor recheck your cholesterol next month. Your triglycerides have run a bit high in the 300s, a normal value is < 150

## 2020-11-09 NOTE — Progress Notes (Signed)
Patient ID: [name]                 DOB: 1950/06/19                    MRN: [id number]     HPI: Ashley Conley is a 70 y.o. female patient referred to lipid clinic by Dr Johnsie Cancel. PMH is significant for CAD s/p NSTEMI with PCI to circ in 12/2012, cath 02/2015 showed 85% dist LAD-1 lesion and 100% dist LAD-2 lesion treated with PCI, unstable angina, HTN, HLD, pre-DM (A1c 5.9% 12/2019), and remote breast cancer.  Patient previously tried a statin ~10 years ago (either Lipitor or Zocor).  She states her doctor checked her LFTs after one month and they were fine.  At her 6 month check-up, she states her enzymes were so elevated that they admitted her straight to the hospital. She did have a liver biopsy that was normal.  She was advised to never take a statin again and has never had a rechallenge of statin since this episode. She was then started on Praluent but had difficulty using the pre-filled pens. She switched to [facility] insurance after a few injections and was then unable to afford treatment. She was referred to [facility] clinical trial, however she screen-failed due to potential for statin-induced liver failure.  I saw pt in 04/2017 and started her on Repatha, she was also approved for pt assistance as cost was prohibitive due to her Med D plan having a $435 deductible. Follow up labs showed excellent reduction in LDL from 121 to 31. She was denied from patient assistance in 2020, I advised her to look for a different insurance plan without a deductible so that PCSK9i would be affordable, she did not. She was eventually agreeable to paying through her deductible and resumed Repatha in 2021 with occasional injection site reaction. A few months ago, pt received notification that Repatha is no longer on formulary with her insurance and that Praluent is preferred. Pt was approved for Praluent but in April wouldn't start taking it due to concern over side effects, even after discussion with PharmD that [place]  works very similarly to [employer] which she responded well to. She then called in July reporting she was taking the Praluent, med list wasn't updated.  Pt reports she is taking Praluent again since her last visit with Dr Johnsie Cancel. Had given 2 injections, then had an issue with some of them injecting, then stopped when she got COVID earlier this year. Has given 1 injection last week. Does have a slight bruise on her abdomen still. Reports more runny nose with Praluent than with Repatha. Praluent is the only covered PCSk9i on her current insurance formulary. Nexletol not covered, Vascepa expensive with 49% copay. Her twin sister had triple bypass surgery recently.  Current Medications: ezetimibe 10mg  daily, Praluent 150mg  Q2W Intolerances: statins - elevated LFTs/ ? Liver failure Risk Factors: CAD s/p NSTEMI and multiple PCIs LDL goal: 55mg /dL  Diet: Dairy and egg free diet. Cereal with almond milk or homemade brown/whole wheat toast in the morning. Lunch - naan bread and avocado. Dinner - salmon and cauliflower. Fish 3x a week, also likes chicken, avoids red meat. No other carbs aside from bread. No soda/juice, has a dessert twice a week. No alcohol.  Exercise: Uses her treadmill every other day for 1/2 hour. Walks her dog twice a day. 9,000-11,000 steps per day.  Family History: Pt has an identical twin sister who lives in [place].  No cardiac events but diabetic.  Son has elevated cholesterol controlled with diet.   Social History: Denies tobacco, alcohol, and illicit drug use.  Labs: 12/17/19: TC 112, TG 342, HDL 41, LDL 34 (Repatha 140mg  Q2W + ezetimibe 10mg  daily) 07/03/17: TC 111, TG 173, HDL 45, LDL 31 (Repatha 140mg  Q2W + ezetimibe 10mg  daily) 05/01/17: TC 223, TG 309, HDL 40, LDL 121 (ezetimibe 10mg  daily) 03/10/15: TC 252, TG 265, HDL 41, LDL 158 (no LLT)  Past Medical History:  Diagnosis Date   CAD (coronary artery disease)    a. a. S/p DES to LCX 12/2012. b. 02/2015: s/p successful but  difficult PTCA to dLAD-1 c/b subintimal dissection requiring balloon inflations/DES, and PTCA to dLAD-2.   Cancer of right breast ([place]) 1992   Diverticulitis    a. bowel resection in 10/2013.   Hyperglycemia    Hyperlipidemia    a. Statins caused to elevated LFTs.   Hypertension    NSTEMI (non-ST elevated myocardial infarction) ([place]) 12/2012    Current Outpatient Medications on File Prior to Visit  Medication Sig Dispense Refill   aspirin 81 MG chewable tablet Chew 81 mg by mouth daily.      Coenzyme Q10 (COQ-10) 100 MG capsule Take 100 mg by mouth every evening.      ezetimibe (ZETIA) 10 MG tablet Take 1 tablet (10 mg total) by mouth daily. 90 tablet 2   isosorbide mononitrate (IMDUR) 30 MG 24 hr tablet Take 0.5 tablets (15 mg total) by mouth daily. 45 tablet 2   metoprolol tartrate (LOPRESSOR) 25 MG tablet Take 1 tablet (25 mg total) by mouth 2 (two) times daily. 180 tablet 2   Multiple Vitamin (MULTIVITAMIN) tablet Take 1 tablet by mouth daily.      nitroGLYCERIN (NITROSTAT) 0.4 MG SL tablet DISSOLVE ONE TABLET UNDER THE TONGUE EVERY 5 MINUTES AS NEEDED FOR CHEST PAIN.  DO NOT EXCEED A TOTAL OF 3 DOSES IN 15 MINUTES 25 tablet 6   No current facility-administered medications on file prior to visit.    Allergies  Allergen Reactions   Statins Other (See Comments)    Liver failure from previously taking   Sulfa Antibiotics Nausea And Vomiting   Dairy Aid [Lactase] Other (See Comments)    Lactose intolerant    Advil [Ibuprofen] Itching    Assessment/Plan:  1. Hyperlipidemia - LDL 34 at goal < 55 given progressive ASCVD. Pt will continue on Praluent 150mg  Q2W and ezetimibe 10mg  daily. Praluent has been expensive (has $480 deductible) and she tolerated Repatha better in the past, but this is not currently covered on her formulary. Will see if [facility] Downer is covered any better under her formulary. If not, she is agreeable to continuing on current meds. She will have her PCP recheck labs next  month once she's completed 3 injections of Praluent. Her TG have been elevated, discussed starting a fibrate if needed (Vascepa not covered well on her formulary - has 49% copay).  Ashley Conley, PharmD, BCACP, [facility] [address], [place], [place] [address] Phone: [phone]; Fax: [phone] 11/09/2020 2:35 PM

## 2020-11-17 ENCOUNTER — Telehealth: Payer: Self-pay | Admitting: Pharmacist

## 2020-11-17 NOTE — Telephone Encounter (Signed)
Pt returned call and is interested in changing to [facility]. She just picked up 6 Praluent injections so she will not need to start on Leqvio until next year. Advised her to call clinic when she uses her last injection and will plan to change to [facility] at that time.

## 2020-11-17 NOTE — Telephone Encounter (Signed)
Leqvio benefit investigation shows that pt would have $0 copay (Medicare B covering 80% of injection cost and her supplement covering the remaining 20%). She does have an annual [phone] deductible that her supplement does not cover, but she has already met this for the 2022 year.  Left message to discuss with pt. Her lipids are currently well controlled on Praluent and ezetimibe, but her Praluent copay is high.

## 2021-01-04 NOTE — Progress Notes (Signed)
Cardiology Office Note    Date:  01/13/2021   ID:  Ashley Conley, DOB 11/30/1950, MRN [id number]  PCP:  Imagene Riches, NP  Cardiologist: Dr. Johnsie Cancel  CC: follow up- 6 month.   History of Present Illness:   70 y.o. with remote Breast CA, diverticulitis with bowel resection Multiple interventions for CAD. Last cath 03/10/15 With patent stent in circumflex no significant disease in RCA and difficult intervention to tight mid LAD Had subintimal dissection with no reflow ultimately got TIM I-3 flow     Has had liver failure with statins. Been on / off Repatha affordability issues 10/08/19 had some issues with erythema of injection site upper inner thigh Also complained of runny noes.   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 a. S/p DES to LCX 12/2012. b. 02/2015: s/p successful but difficult PTCA to dLAD-1 c/b subintimal dissection requiring balloon inflations/DES, and PTCA to dLAD-2.   Cancer of right breast ([place]) 1992   Diverticulitis    a. bowel resection in 10/2013.   Hyperglycemia    Hyperlipidemia    a. Statins caused to elevated LFTs.   Hypertension    NSTEMI (non-ST elevated myocardial infarction) ([place]) 12/2012    Past Surgical History:  Procedure Laterality Date   BREAST BIOPSY Right 1990   BREAST RECONSTRUCTION Right 1990   w/implant    CARDIAC CATHETERIZATION N/A 03/10/2015   Procedure: Left Heart Cath and Coronary Angiography;  Surgeon: Troy Sine, MD;  Location: [facility];  Service: Cardiovascular;  Laterality: N/A;   COLECTOMY  2015   "for diverticulitis"   CORONARY ANGIOPLASTY WITH STENT PLACEMENT  01/12/2013   LEFT HEART CATH N/A 01/12/2013   Procedure: LEFT HEART CATH;  Surgeon: Wellington Hampshire, MD;  Location: [facility];  Service: Cardiovascular;  Laterality: N/A;   LIVER BIOPSY  2000s   Performed for abnormal LFTs in the setting of statin use   MASTECTOMY Bilateral [place] (PCI-S)  01/12/2013   Procedure: PERCUTANEOUS CORONARY STENT INTERVENTION (PCI-S);  Surgeon: Wellington Hampshire, MD;  Location: [facility] CATH LAB;  Service: Cardiovascular;;  Mid CX DES   PROCTOSCOPY N/A 10/27/2013   Procedure: RIGID PROCTOSCOPY;  Surgeon: Leighton Ruff, MD;  Location: [facility];  Service: General;  Laterality: N/A;   RECONSTRUCTION BREAST W/ TRAM FLAP Bilateral 1992   TONSILLECTOMY  1950s   TOTAL ABDOMINAL HYSTERECTOMY  2005    Current Medications: Outpatient Medications Prior to Visit  Medication Sig Dispense Refill   Alirocumab (PRALUENT) 150 MG/ML SOAJ Inject 1 pen into the skin every 14 (fourteen) days. 2 mL 11   aspirin 81 MG chewable tablet Chew 81 mg by mouth daily.      Coenzyme  Q10 (COQ-10) 100 MG capsule Take 100 mg by mouth every evening.      ezetimibe (ZETIA) 10 MG tablet Take 1 tablet (10 mg total) by mouth daily. 90 tablet 2   isosorbide mononitrate (IMDUR) 30 MG 24 hr tablet Take 0.5 tablets (15 mg total) by mouth daily. 45 tablet 2   metoprolol tartrate (LOPRESSOR) 25 MG tablet Take 1 tablet (25 mg total) by mouth 2 (two) times daily. 180 tablet 2   Multiple Vitamin (MULTIVITAMIN) tablet Take 1 tablet by mouth daily.      nitroGLYCERIN (NITROSTAT) 0.4 MG SL tablet DISSOLVE ONE TABLET UNDER THE TONGUE EVERY 5 MINUTES AS NEEDED FOR CHEST PAIN.  DO NOT EXCEED A TOTAL OF  3 DOSES IN 15 MINUTES 25 tablet 6   No facility-administered medications prior to visit.     Allergies:   Statins, Sulfa antibiotics, Dairy aid [tilactase], and Advil [ibuprofen]   Social History   Socioeconomic History   Marital status: Married    Spouse name: Not on file   Number of children: Not on file   Years of education: Not on file   Highest education level: Not on file  Occupational History   Not on file  Tobacco Use   Smoking status: Never   Smokeless tobacco: Never  Substance and Sexual Activity   Alcohol use: No   Drug use: No   Sexual activity: Yes    Birth control/protection: Post-menopausal  Other Topics Concern   Not on file  Social History Narrative   Not on file   Social Determinants of Health   Financial Resource Strain: Not on file  Food Insecurity: Not on file  Transportation Needs: Not on file  Physical Activity: Not on file  Stress: Not on file  Social Connections: Not on file     Family History:  The patient's family history includes Healthy in her mother; Heart attack in her father, maternal grandfather, and paternal grandmother; Heart disease in her father.      ROS:   Please see the history of present illness.    ROS All other systems reviewed and are negative.   PHYSICAL EXAM:   VS:  BP 124/82   Pulse (!) 53   Ht 5\' 2"  (1.575 m)   Wt 147 lb 6.4 oz (66.9 kg)   SpO2 98%   BMI 26.96 kg/m    Affect appropriate Healthy:  appears stated age 44: normal Neck supple with no adenopathy JVP normal no bruits no thyromegaly Lungs clear with no wheezing and good diaphragmatic motion Heart:  S1/S2 no murmur, no rub, gallop or click PMI normal Abdomen: benighn, BS positve, no tenderness, no AAA no bruit.  No HSM or HJR Distal pulses intact with no bruits No edema Neuro non-focal Skin warm and dry Tattoo right forearm  No muscular weakness    Wt Readings from Last 3 Encounters:  01/13/21 147 lb 6.4 oz (66.9 kg)  08/03/20 149 lb  (67.6 kg)  01/14/20 146 lb (66.2 kg)      Studies/Labs Reviewed:   EKG:  12/06/18 SR rate 66 normal 01/13/2021 SR rate 62 normal 01/13/2021 NSR rate 53 normal  Recent Labs: No results found for requested labs within last 8760 hours.   Lipid Panel    Component Value Date/Time   CHOL 111 07/03/2017 0958   TRIG 173 (H) 07/03/2017 0958   HDL 45 07/03/2017 0958   CHOLHDL 2.5 07/03/2017 0958   CHOLHDL 6.1 03/10/2015 0324   VLDL 53 (  H) 03/10/2015 0324   LDLCALC 31 07/03/2017 0958   LDLDIRECT 151 (H) 05/01/2017 1057    Additional studies/ records that were reviewed today include:  03/10/15 Left Heart Cath and Coronary Angiography  Conclusion  Prox RCA lesion, 30% stenosed. Mid RCA to Dist RCA lesion, 40% stenosed. Dist LAD-1 lesion, 85% stenosed. Post intervention, there is a 0% residual stenosis. The lesion was not previously treated. Dist LAD-2 lesion, 100% stenosed. Post intervention, there is a 0% residual stenosis. The left ventricular systolic function is normal.   Normal LV function.   Progressive native coronary obstructive disease with eccentric 85% somewhat calcified mid LAD stenosis; widely patent stent in the left circumflex coronary artery; and a dominant RCA with smooth 30% proximal and 30-40% narrowing in the region of the acute margin.   Difficult but ultimately successful percutaneous coronary intervention to the 85% mid LAD stenosis which was complicated by subintimal dissection resulting in no flow phenomena distally, chest pain, and transient ST segment elevation.  Ultimately after numerous balloon inflations, a 2.538 mm Xience Alpine DES stent was inserted in the mid LAD distally and PTCA alone was performed in the apical segment which resulted in restoration of TIMI-3 flow and residual narrowing of 0% at all sites. RECOMMENDATION: Patient should continue dual antiplatelet therapy, probably indefinitely.     ASSESSMENT & PLAN:   CAD: continue ASA and BB.  Dyspnea with Brilinta and pruritis headache with Effient continue plavix   Essential HTN: Well controlled.  Continue current medications and low sodium Dash type diet.    HLD: F/U Pharm D when Praluent done to change to [facility]  Pre diabetes:Discussed low carb diet.  Target hemoglobin A1c is 6.5 or less.  Continue current medications.  F/U in a year    Jenkins Rouge

## 2021-01-13 ENCOUNTER — Other Ambulatory Visit: Payer: Self-pay

## 2021-01-13 ENCOUNTER — Encounter: Payer: Self-pay | Admitting: Cardiovascular Disease

## 2021-01-13 ENCOUNTER — Ambulatory Visit (INDEPENDENT_AMBULATORY_CARE_PROVIDER_SITE_OTHER): Payer: Medicare Other | Admitting: Cardiovascular Disease

## 2021-01-13 VITALS — BP 124/82 | HR 53 | Ht 62.0 in | Wt 147.4 lb

## 2021-01-13 DIAGNOSIS — E782 Mixed hyperlipidemia: Secondary | ICD-10-CM | POA: Diagnosis not present

## 2021-01-13 DIAGNOSIS — I25110 Atherosclerotic heart disease of native coronary artery with unstable angina pectoris: Secondary | ICD-10-CM

## 2021-01-13 NOTE — Patient Instructions (Signed)
Medication Instructions:  Your physician recommends that you continue on your current medications as directed. Please refer to the Current Medication list given to you today.  *If you need a refill on your cardiac medications before your next appointment, please call your pharmacy*   Lab Work: NONE If you have labs (blood work) drawn today and your tests are completely normal, you will receive your results only by: [place] (if you have MyChart) OR A paper copy in the mail If you have any lab test that is abnormal or we need to change your treatment, we will call you to review the results.   Testing/Procedures: NONE   Follow-Up: At [facility], you and your health needs are our priority.  As part of our continuing mission to provide you with exceptional heart care, we have created designated Provider Care Teams.  These Care Teams include your primary Cardiologist (physician) and Advanced Practice Providers (APPs -  Physician Assistants and Nurse Practitioners) who all work together to provide you with the care you need, when you need it.  We recommend signing up for the patient portal called "MyChart".  Sign up information is provided on this After Visit Summary.  MyChart is used to connect with patients for Virtual Visits (Telemedicine).  Patients are able to view lab/test results, encounter notes, upcoming appointments, etc.  Non-urgent messages can be sent to your provider as well.   To learn more about what you can do with MyChart, go to [email or web address].    Your next appointment:   6 months   The format for your next appointment:   In Person  Provider:   DR. Johnsie Cancel  Your physician recommends that you schedule a follow-up appointment with the Lipid clinic in 6 weeks for medication management.    Other Instructions

## 2021-02-17 ENCOUNTER — Other Ambulatory Visit: Payer: Self-pay | Admitting: Cardiovascular Disease

## 2021-02-19 ENCOUNTER — Other Ambulatory Visit: Payer: Self-pay | Admitting: Cardiovascular Disease

## 2021-02-22 ENCOUNTER — Other Ambulatory Visit: Payer: Self-pay

## 2021-02-22 ENCOUNTER — Telehealth: Payer: Self-pay | Admitting: Pharmacist

## 2021-02-22 DIAGNOSIS — E785 Hyperlipidemia, unspecified: Secondary | ICD-10-CM

## 2021-02-22 NOTE — Telephone Encounter (Signed)
Your Leqvio injection is scheduled on 03/03/21 at 10am. Please arrive at [facility] and enter the hospital through [place] (the [employer]) that's located at [facility] 15 minutes before your scheduled injection time. Walk in to the registration desk and let them know that you are scheduled for an injection in the infusion center. They will register you and take you to your appointment.   Marion Downer is a subcutaneous injection that will lower your LDL cholesterol by 50%. If this is your first injection, your next injection will be due in 3 months. If this is NOT your first injection, your next injection will be due in 6 months. If you have not heard from [facility] to schedule your next appointment within 2 weeks of your next anticipated injection, please call [facility] to schedule this at:                [phone] - if you are seen at the [facility] office               [phone] - if you are seen at the [facility] office

## 2021-02-22 NOTE — Telephone Encounter (Signed)
Called pt to follow up in transitioning from Praluent to [facility]. She states she gave her last Praluent dose 3 weeks ago. Reports Praluent made her sleep worse and also was more expensive.  [place], please coordinate scheduling first Leqvio injection with pt. Mondays and Fridays are not good for her, neither is early morning.

## 2021-03-01 ENCOUNTER — Ambulatory Visit: Payer: Medicare Other

## 2021-03-03 ENCOUNTER — Ambulatory Visit (HOSPITAL_COMMUNITY)
Admission: RE | Admit: 2021-03-03 | Discharge: 2021-03-03 | Disposition: A | Discharge status: Home / Self Care | Payer: Medicare Other | Source: Ambulatory Visit | Attending: Cardiovascular Disease | Admitting: Cardiovascular Disease

## 2021-03-03 ENCOUNTER — Other Ambulatory Visit: Payer: Self-pay

## 2021-03-03 DIAGNOSIS — E785 Hyperlipidemia, unspecified: Secondary | ICD-10-CM | POA: Insufficient documentation

## 2021-03-03 MED ORDER — INCLISIRAN SODIUM 284 MG/1.5ML ~~LOC~~ SOSY
284.0000 mg | PREFILLED_SYRINGE | Freq: Once | SUBCUTANEOUS | Status: AC
  Administered 2021-03-03: 284 mg via SUBCUTANEOUS

## 2021-03-03 MED ORDER — INCLISIRAN SODIUM 284 MG/1.5ML ~~LOC~~ SOSY
PREFILLED_SYRINGE | SUBCUTANEOUS | Status: AC
  Filled 2021-03-03: qty 1.5

## 2021-03-07 ENCOUNTER — Other Ambulatory Visit: Payer: Self-pay

## 2021-03-07 DIAGNOSIS — E785 Hyperlipidemia, unspecified: Secondary | ICD-10-CM

## 2021-06-01 ENCOUNTER — Ambulatory Visit (HOSPITAL_COMMUNITY)
Admission: RE | Admit: 2021-06-01 | Discharge: 2021-06-01 | Disposition: A | Discharge status: Home / Self Care | Payer: Medicare Other | Source: Ambulatory Visit | Attending: Cardiovascular Disease | Admitting: Cardiovascular Disease

## 2021-06-01 DIAGNOSIS — E785 Hyperlipidemia, unspecified: Secondary | ICD-10-CM | POA: Insufficient documentation

## 2021-06-01 MED ORDER — INCLISIRAN SODIUM 284 MG/1.5ML ~~LOC~~ SOSY
284.0000 mg | PREFILLED_SYRINGE | Freq: Once | SUBCUTANEOUS | Status: AC
  Administered 2021-06-01: 284 mg via SUBCUTANEOUS

## 2021-06-01 MED ORDER — INCLISIRAN SODIUM 284 MG/1.5ML ~~LOC~~ SOSY
PREFILLED_SYRINGE | SUBCUTANEOUS | Status: AC
  Filled 2021-06-01: qty 1.5

## 2021-06-02 ENCOUNTER — Telehealth: Payer: Self-pay

## 2021-06-02 DIAGNOSIS — I200 Unstable angina: Secondary | ICD-10-CM

## 2021-06-02 DIAGNOSIS — E785 Hyperlipidemia, unspecified: Secondary | ICD-10-CM

## 2021-06-02 NOTE — Telephone Encounter (Signed)
-----   Message from Leeroy Bock, [place] sent at 06/01/2021  7:57 AM EDT ----- ?Regarding: RE: labs permission ?Yes that's fine, she's getting an injection today so would schedule labs in a few weeks. ?----- Message ----- ?From: Allean Found, [place] ?Sent: 05/31/2021  12:02 PM EDT ?To: Leeroy Bock, RPH-CPP ?Subject: labs permission                               ? ?Permission to order lipid panel for post 2nd shot leqvio? ? ? ?

## 2021-06-02 NOTE — Telephone Encounter (Signed)
Called and spoke w/pt and stated we needed fasting labs completed she stated that she wanted me to mail the orders to her so she can complete them at her doctor's office in [place]. I voiced understanding ? ?

## 2021-06-06 ENCOUNTER — Other Ambulatory Visit: Payer: Self-pay

## 2021-06-06 DIAGNOSIS — E785 Hyperlipidemia, unspecified: Secondary | ICD-10-CM

## 2021-06-06 DIAGNOSIS — I200 Unstable angina: Secondary | ICD-10-CM

## 2021-07-15 NOTE — Progress Notes (Signed)
Cardiology Office Note    Date:  07/22/2021   ID:  Ashley Conley, DOB 18-Dec-1950, MRN [id number]  PCP:  Imagene Riches, NP  Cardiologist: Dr. Johnsie Cancel  CC: follow up- 6 month.   History of Present Illness:   71 y.o. with remote Breast CA, diverticulitis with bowel resection Multiple interventions for CAD. Last cath 03/10/15 With patent stent in circumflex no significant disease in RCA and difficult intervention to tight mid LAD Had subintimal dissection with no reflow ultimately got TIM I-3 flow     Has had liver failure with statins. Been on / off Repatha affordability issues 10/08/19 had some issues with erythema of injection site upper inner thigh Also complained of runny noes.  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 a. S/p DES to LCX 12/2012. b. 02/2015: s/p successful but difficult PTCA to dLAD-1 c/b subintimal dissection requiring balloon inflations/DES, and PTCA to dLAD-2.   Cancer of right breast ([place]) 1992   Diverticulitis    a. bowel resection in 10/2013.   Hyperglycemia    Hyperlipidemia    a. Statins caused to elevated LFTs.   Hypertension    NSTEMI (non-ST elevated myocardial infarction) ([place]) 12/2012    Past Surgical History:  Procedure Laterality Date   BREAST BIOPSY Right 1990   BREAST RECONSTRUCTION Right 1990   w/implant   CARDIAC CATHETERIZATION N/A 03/10/2015   Procedure: Left  Heart Cath and Coronary Angiography;  Surgeon: Troy Sine, MD;  Location: [facility] CV LAB;  Service: Cardiovascular;  Laterality: N/A;   COLECTOMY  2015   "for diverticulitis"   CORONARY ANGIOPLASTY WITH STENT PLACEMENT  01/12/2013   LEFT HEART CATH N/A 01/12/2013   Procedure: LEFT HEART CATH;  Surgeon: Wellington Hampshire, MD;  Location: [place] [facility] CATH LAB;  Service: Cardiovascular;  Laterality: N/A;   LIVER BIOPSY  2000s   Performed for abnormal LFTs in the setting of statin use   MASTECTOMY Bilateral [place] (PCI-S)  01/12/2013   Procedure: PERCUTANEOUS CORONARY STENT INTERVENTION (PCI-S);  Surgeon: Wellington Hampshire, MD;  Location: [facility] CATH LAB;  Service: Cardiovascular;;  Mid CX DES   PROCTOSCOPY N/A 10/27/2013   Procedure: RIGID PROCTOSCOPY;  Surgeon: Leighton Ruff, MD;  Location: [facility];  Service: General;  Laterality: N/A;   RECONSTRUCTION BREAST W/ TRAM FLAP Bilateral 1992   TONSILLECTOMY  1950s   TOTAL ABDOMINAL HYSTERECTOMY  2005    Current Medications: Outpatient Medications Prior to Visit  Medication Sig Dispense Refill   aspirin 81 MG chewable tablet Chew 81 mg by mouth daily.      Coenzyme Q10 (COQ-10) 100 MG capsule Take 100 mg by mouth every evening.      ezetimibe (ZETIA) 10 MG tablet TAKE 1 TABLET BY MOUTH EVERY  DAY 90 tablet 3   inclisiran (LEQVIO) 284 MG/1.5ML SOSY injection Inject 284 mg into the skin every 6 (six) months.     isosorbide mononitrate (IMDUR) 30 MG 24 hr tablet TAKE 1/2 OF A TABLET (15 MG TOTAL) BY MOUTH DAILY 45 tablet 3   metoprolol tartrate (LOPRESSOR) 25 MG tablet TAKE 1 TABLET BY MOUTH TWICE A DAY 180 tablet 3   Multiple Vitamin (MULTIVITAMIN) tablet Take 1 tablet by mouth daily.      nitroGLYCERIN (NITROSTAT) 0.4 MG SL tablet DISSOLVE ONE TABLET UNDER THE TONGUE EVERY 5 MINUTES AS NEEDED FOR CHEST PAIN.  DO NOT EXCEED A TOTAL OF 3 DOSES IN 15 MINUTES 25 tablet 6   No facility-administered medications prior to  visit.     Allergies:   Eggs or egg-derived products, Statins, Sulfa antibiotics, Dairy aid [tilactase], and Advil [ibuprofen]   Social History   Socioeconomic History   Marital status: Married    Spouse name: Not on file   Number of children: Not on file   Years of education: Not on file   Highest education level: Not on file  Occupational History   Not on file  Tobacco Use   Smoking status: Never   Smokeless tobacco: Never  Substance and Sexual Activity   Alcohol use: No   Drug use: No   Sexual activity: Yes    Birth control/protection: Post-menopausal  Other Topics Concern   Not on file  Social History Narrative   Not on file   Social Determinants of Health   Financial Resource Strain: Not on file  Food Insecurity: Not on file  Transportation Needs: Not on file  Physical Activity: Not on file  Stress: Not on file  Social Connections: Not on file     Family History:  The patient's family history includes Healthy in her mother; Heart attack in her father, maternal grandfather, and paternal grandmother; Heart disease in her father.      ROS:   Please see the history of present illness.    ROS All other systems reviewed and are negative.   PHYSICAL EXAM:   VS:  BP 118/62   Pulse (!) 59   Ht '5\' 2"'$  (1.575 m)   Wt 140 lb (63.5 kg)   SpO2 96%   BMI 25.61 kg/m    Affect appropriate Healthy:  appears stated age 64: normal Neck supple with no adenopathy JVP normal no bruits no thyromegaly Lungs clear with no wheezing and good diaphragmatic motion Heart:  S1/S2 no murmur, no rub, gallop or click PMI normal Abdomen: benighn, BS positve, no tenderness, no AAA no bruit.  No HSM or HJR post lap choly  Distal pulses intact with no bruits No edema Neuro non-focal Skin warm and dry Tattoo right forearm  No muscular weakness    Wt Readings from Last 3 Encounters:  07/22/21 140 lb (63.5 kg)  01/13/21 147 lb 6.4 oz (66.9 kg)  08/03/20 149 lb (67.6 kg)       Studies/Labs Reviewed:   EKG:  12/06/18 SR rate 66 normal 07/22/2021 SR rate 62 normal 07/22/2021 NSR rate 53 normal  Recent Labs: No results found for requested labs within last 365 days.   Lipid Panel    Component Value Date/Time   CHOL 111 07/03/2017 0958   TRIG 173 (H) 07/03/2017 0958   HDL 45 07/03/2017 0958   CHOLHDL 2.5 07/03/2017 0958   CHOLHDL 6.1 03/10/2015 0324   VLDL 53 (H) 03/10/2015 0324   LDLCALC  31 07/03/2017 0958   LDLDIRECT 151 (H) 05/01/2017 1057    Additional studies/ records that were reviewed today include:  03/10/15 Left Heart Cath and Coronary Angiography  Conclusion  Prox RCA lesion, 30% stenosed. Mid RCA to Dist RCA lesion, 40% stenosed. Dist LAD-1 lesion, 85% stenosed. Post intervention, there is a 0% residual stenosis. The lesion was not previously treated. Dist LAD-2 lesion, 100% stenosed. Post intervention, there is a 0% residual stenosis. The left ventricular systolic function is normal.   Normal LV function.   Progressive native coronary obstructive disease with eccentric 85% somewhat calcified mid LAD stenosis; widely patent stent in the left circumflex coronary artery; and a dominant RCA with smooth 30% proximal and 30-40% narrowing in the region of the acute margin.   Difficult but ultimately successful percutaneous coronary intervention to the 85% mid LAD stenosis which was complicated by subintimal dissection resulting in no flow phenomena distally, chest pain, and transient ST segment elevation.  Ultimately after numerous balloon inflations, a 2.538 mm Xience Alpine DES stent was inserted in the mid LAD distally and PTCA alone was performed in the apical segment which resulted in restoration of TIMI-3 flow and residual narrowing of 0% at all sites. RECOMMENDATION: Patient should continue dual antiplatelet therapy, probably indefinitely.     ASSESSMENT & PLAN:   CAD: continue ASA and BB. Dyspnea with Brilinta and pruritis headache with  Effient continue plavix   Essential HTN: Well controlled.  Continue current medications and low sodium [place] type diet.    HLD: F/U Pharm D now on Inclisiran ( Leqvio ) LDL 27   Pre diabetes:Discussed low carb diet.  Target hemoglobin A1c is 6.5 or less.  Continue current medications.  F/U in a year    Jenkins Rouge

## 2021-07-22 ENCOUNTER — Encounter: Payer: Self-pay | Admitting: Cardiovascular Disease

## 2021-07-22 ENCOUNTER — Ambulatory Visit (INDEPENDENT_AMBULATORY_CARE_PROVIDER_SITE_OTHER): Payer: Medicare Other | Admitting: Cardiovascular Disease

## 2021-07-22 VITALS — BP 118/62 | HR 59 | Ht 62.0 in | Wt 140.0 lb

## 2021-07-22 DIAGNOSIS — I25110 Atherosclerotic heart disease of native coronary artery with unstable angina pectoris: Secondary | ICD-10-CM

## 2021-07-22 DIAGNOSIS — I200 Unstable angina: Secondary | ICD-10-CM | POA: Diagnosis not present

## 2021-07-22 DIAGNOSIS — I10 Essential (primary) hypertension: Secondary | ICD-10-CM

## 2021-07-22 DIAGNOSIS — E785 Hyperlipidemia, unspecified: Secondary | ICD-10-CM | POA: Diagnosis not present

## 2021-07-22 NOTE — Patient Instructions (Addendum)
Medication Instructions:  Your physician recommends that you continue on your current medications as directed. Please refer to the Current Medication list given to you today.  *If you need a refill on your cardiac medications before your next appointment, please call your pharmacy*  Lab Work: If you have labs (blood work) drawn today and your tests are completely normal, you will receive your results only by: [place] (if you have MyChart) OR A paper copy in the mail If you have any lab test that is abnormal or we need to change your treatment, we will call you to review the results.  Testing/Procedures: None ordered today.  Follow-Up: At [facility], you and your health needs are our priority.  As part of our continuing mission to provide you with exceptional heart care, we have created designated Provider Care Teams.  These Care Teams include your primary Cardiologist (physician) and Advanced Practice Providers (APPs -  Physician Assistants and Nurse Practitioners) who all work together to provide you with the care you need, when you need it.  We recommend signing up for the patient portal called "MyChart".  Sign up information is provided on this After Visit Summary.  MyChart is used to connect with patients for Virtual Visits (Telemedicine).  Patients are able to view lab/test results, encounter notes, upcoming appointments, etc.  Non-urgent messages can be sent to your provider as well.   To learn more about what you can do with MyChart, go to [email or web address].    Your next appointment:   6 month  The format for your next appointment:   In Person  Provider:   Jenkins Rouge, MD {  Important Information About Sugar

## 2021-12-01 ENCOUNTER — Encounter (HOSPITAL_COMMUNITY)
Admission: RE | Admit: 2021-12-01 | Discharge: 2021-12-01 | Disposition: A | Discharge status: Home / Self Care | Payer: Medicare Other | Source: Ambulatory Visit | Attending: Cardiovascular Disease | Admitting: Cardiovascular Disease

## 2021-12-01 DIAGNOSIS — I200 Unstable angina: Secondary | ICD-10-CM | POA: Insufficient documentation

## 2021-12-01 DIAGNOSIS — E785 Hyperlipidemia, unspecified: Secondary | ICD-10-CM | POA: Insufficient documentation

## 2021-12-01 MED ORDER — INCLISIRAN SODIUM 284 MG/1.5ML ~~LOC~~ SOSY
PREFILLED_SYRINGE | SUBCUTANEOUS | Status: AC
Start: 2021-12-01 — End: 2021-12-01
  Administered 2021-12-01: 284 mg via SUBCUTANEOUS
  Filled 2021-12-01: qty 1.5

## 2021-12-01 MED ORDER — INCLISIRAN SODIUM 284 MG/1.5ML ~~LOC~~ SOSY: 284.0000 mg | PREFILLED_SYRINGE | Freq: Once | SUBCUTANEOUS | Status: AC

## 2021-12-27 ENCOUNTER — Other Ambulatory Visit: Payer: Self-pay | Admitting: Pharmacist

## 2021-12-27 DIAGNOSIS — E782 Mixed hyperlipidemia: Secondary | ICD-10-CM

## 2021-12-27 DIAGNOSIS — I25110 Atherosclerotic heart disease of native coronary artery with unstable angina pectoris: Secondary | ICD-10-CM

## 2022-01-16 NOTE — Progress Notes (Signed)
Cardiology Office Note    Date:  01/16/2022   ID:  Ashley Conley, DOB March 20, 1950, MRN [id number]  PCP:  Rhea Bleacher, NP  Cardiologist: Dr. Johnsie Cancel  CC: follow up- 6 month.   History of Present Illness:   71 y.o. with remote Breast CA, diverticulitis with bowel resection Multiple interventions for CAD. Last cath 03/10/15 With patent stent in circumflex no significant disease in RCA and difficult intervention to tight mid/distal LAD Had subintimal dissection with no reflow ultimately got TIM I-3 flow     Has had liver failure with statins. Been on / off Repatha affordability issues 10/08/19 had some issues with erythema of injection site upper inner thigh Also complained of runny noes.  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 New pill with lactulose filler and bothering her stomach  Past Medical History:  Diagnosis Date   CAD (coronary artery disease)    a. a. S/p DES to LCX 12/2012. b. 02/2015: s/p successful but difficult PTCA to dLAD-1 c/b subintimal dissection requiring balloon inflations/DES, and PTCA to dLAD-2.   Cancer of right breast ([place]) 1992   Diverticulitis    a. bowel resection in 10/2013.   Hyperglycemia    Hyperlipidemia    a. Statins caused to elevated LFTs.   Hypertension    NSTEMI (non-ST elevated myocardial infarction) ([place]) 12/2012    Past Surgical History:  Procedure Laterality Date   BREAST BIOPSY Right 1990   BREAST RECONSTRUCTION Right 1990    w/implant   CARDIAC CATHETERIZATION N/A 03/10/2015   Procedure: Left Heart Cath and Coronary Angiography;  Surgeon: Troy Sine, MD;  Location: [place] CV LAB;  Service: Cardiovascular;  Laterality: N/A;   COLECTOMY  2015   "for diverticulitis"   CORONARY ANGIOPLASTY WITH STENT PLACEMENT  01/12/2013   LEFT HEART CATH N/A 01/12/2013   Procedure: LEFT HEART CATH;  Surgeon: Wellington Hampshire, MD;  Location: [place] CATH LAB;  Service: Cardiovascular;  Laterality: N/A;   LIVER BIOPSY  2000s   Performed for abnormal LFTs in the setting of statin use   MASTECTOMY Bilateral [place] (PCI-S)  01/12/2013   Procedure: PERCUTANEOUS CORONARY STENT INTERVENTION (PCI-S);  Surgeon: Wellington Hampshire, MD;  Location: [facility] CATH LAB;  Service: Cardiovascular;;  Mid CX DES   PROCTOSCOPY N/A 10/27/2013   Procedure: RIGID PROCTOSCOPY;  Surgeon: Leighton Ruff, MD;  Location: [facility] ORS;  Service: General;  Laterality: N/A;   RECONSTRUCTION BREAST W/ TRAM FLAP Bilateral 1992   TONSILLECTOMY  1950s   TOTAL ABDOMINAL HYSTERECTOMY  2005    Current Medications: Outpatient Medications Prior to Visit  Medication Sig Dispense Refill   aspirin 81 MG chewable tablet Chew 81 mg by mouth daily.      Coenzyme Q10 (COQ-10) 100 MG capsule Take 100 mg by mouth every evening.      ezetimibe (  ZETIA) 10 MG tablet TAKE 1 TABLET BY MOUTH EVERY DAY 90 tablet 3   inclisiran (LEQVIO) 284 MG/1.5ML SOSY injection Inject 284 mg into the skin every 6 (six) months.     isosorbide mononitrate (IMDUR) 30 MG 24 hr tablet TAKE 1/2 OF A TABLET (15 MG TOTAL) BY MOUTH DAILY 45 tablet 3   metoprolol tartrate (LOPRESSOR) 25 MG tablet TAKE 1 TABLET BY MOUTH TWICE A DAY 180 tablet 3   Multiple Vitamin (MULTIVITAMIN) tablet Take 1 tablet by mouth daily.      nitroGLYCERIN (NITROSTAT) 0.4 MG SL tablet DISSOLVE ONE TABLET UNDER THE TONGUE EVERY 5 MINUTES AS NEEDED FOR CHEST PAIN.  DO NOT EXCEED A TOTAL OF 3 DOSES IN 15  MINUTES 25 tablet 6   No facility-administered medications prior to visit.     Allergies:   Eggs or egg-derived products, Statins, Sulfa antibiotics, Dairy aid [tilactase], and Advil [ibuprofen]   Social History   Socioeconomic History   Marital status: Married    Spouse name: Not on file   Number of children: Not on file   Years of education: Not on file   Highest education level: Not on file  Occupational History   Not on file  Tobacco Use   Smoking status: Never   Smokeless tobacco: Never  Substance and Sexual Activity   Alcohol use: No   Drug use: No   Sexual activity: Yes    Birth control/protection: Post-menopausal  Other Topics Concern   Not on file  Social History Narrative   Not on file   Social Determinants of Health   Financial Resource Strain: Not on file  Food Insecurity: Not on file  Transportation Needs: Not on file  Physical Activity: Not on file  Stress: Not on file  Social Connections: Not on file     Family History:  The patient's family history includes Healthy in her mother; Heart attack in her father, maternal grandfather, and paternal grandmother; Heart disease in her father.      ROS:   Please see the history of present illness.    ROS All other systems reviewed and are negative.   PHYSICAL EXAM:   VS:  There were no vitals taken for this visit.   Affect appropriate Healthy:  appears stated age 12: normal Neck supple with no adenopathy JVP normal no bruits no thyromegaly Lungs clear with no wheezing and good diaphragmatic motion Heart:  S1/S2 no murmur, no rub, gallop or click PMI normal Abdomen: benighn, BS positve, no tenderness, no AAA no bruit.  No HSM or HJR post lap choly  Distal pulses intact with no bruits No edema Neuro non-focal Skin warm and dry Tattoo right forearm  No muscular weakness    Wt Readings from Last 3 Encounters:  07/22/21 140 lb (63.5 kg)  01/13/21 147 lb 6.4 oz (66.9 kg)  08/03/20 149 lb (67.6  kg)      Studies/Labs Reviewed:   EKG:  01/26/2022 SR rate 84 normal   Recent Labs: No results found for requested labs within last 365 days.   Lipid Panel    Component Value Date/Time   CHOL 111 07/03/2017 0958   TRIG 173 (H) 07/03/2017 0958   HDL 45 07/03/2017 0958   CHOLHDL 2.5 07/03/2017 0958   CHOLHDL 6.1 03/10/2015 0324   VLDL 53 (H) 03/10/2015 0324   LDLCALC 31 07/03/2017 0958   LDLDIRECT 151 (H) 05/01/2017 1057    Additional studies/ records that were reviewed today include:  03/10/15 Left Heart Cath and Coronary Angiography  Conclusion  Prox RCA lesion, 30% stenosed. Mid RCA to Dist RCA lesion, 40% stenosed. Dist LAD-1 lesion, 85% stenosed. Post intervention, there is a 0% residual stenosis. The lesion was not previously treated. Dist LAD-2 lesion, 100% stenosed. Post intervention, there is a 0% residual stenosis. The left ventricular systolic function is normal.   Normal LV function.   Progressive native coronary obstructive disease with eccentric 85% somewhat calcified mid LAD stenosis; widely patent stent in the left circumflex coronary artery; and a dominant RCA with smooth 30% proximal and 30-40% narrowing in the region of the acute margin.   Difficult but ultimately successful percutaneous coronary intervention to the 85% mid LAD stenosis which was complicated by subintimal dissection resulting in no flow phenomena distally, chest pain, and transient ST segment elevation.  Ultimately after numerous balloon inflations, a 2.538 mm Xience Alpine DES stent was inserted in the mid LAD distally and PTCA alone was performed in the apical segment which resulted in restoration of TIMI-3 flow and residual narrowing of 0% at all sites. RECOMMENDATION: Patient should continue dual antiplatelet therapy, probably indefinitely.     ASSESSMENT & PLAN:   CAD: continue ASA and BB. Dyspnea with Brilinta and pruritis headache with Effient continue plavix Prior stent to OM with  difficult stent procedure to mid/distal LAD in 2017 stable Will change to Toprol 50 mg  Ans see [facility] does not have a dairy filler   Essential HTN: Well controlled.  Continue current medications and low sodium Dash type diet.    HLD: F/U Pharm D now on Inclisiran ( Leqvio ) LDL 27   Pre diabetes:Discussed low carb diet.  Target hemoglobin A1c is 6.5 or less.  Continue current medications.   F/U in a year    Jenkins Rouge

## 2022-01-26 ENCOUNTER — Encounter: Payer: Self-pay | Admitting: Cardiovascular Disease

## 2022-01-26 ENCOUNTER — Encounter: Payer: Medicare Other | Attending: Cardiovascular Disease | Admitting: Cardiovascular Disease

## 2022-01-26 VITALS — BP 120/64 | HR 85 | Ht 62.0 in | Wt 138.6 lb

## 2022-01-26 DIAGNOSIS — I25110 Atherosclerotic heart disease of native coronary artery with unstable angina pectoris: Secondary | ICD-10-CM

## 2022-01-26 DIAGNOSIS — E782 Mixed hyperlipidemia: Secondary | ICD-10-CM

## 2022-01-26 DIAGNOSIS — I10 Essential (primary) hypertension: Secondary | ICD-10-CM | POA: Diagnosis not present

## 2022-01-26 DIAGNOSIS — I200 Unstable angina: Secondary | ICD-10-CM

## 2022-01-26 MED ORDER — METOPROLOL TARTRATE 50 MG PO TABS: 50.0000 mg | ORAL_TABLET | Freq: Two times a day (BID) | ORAL | 3 refills | Status: DC

## 2022-01-26 MED ORDER — METOPROLOL SUCCINATE ER 50 MG PO TB24: 50.0000 mg | ORAL_TABLET | Freq: Every day | ORAL | 3 refills | Status: DC

## 2022-01-26 NOTE — Patient Instructions (Addendum)
Medication Instructions:  Your physician has recommended you make the following change in your medication:  1-START metoprolol succinate 50 mg by mouth daily 2-STOP metoprolol tartrate 25 mg  *If you need a refill on your cardiac medications before your next appointment, please call your pharmacy*  Lab Work: If you have labs (blood work) drawn today and your tests are completely normal, you will receive your results only by: [place] (if you have MyChart) OR A paper copy in the mail If you have any lab test that is abnormal or we need to change your treatment, we will call you to review the results.  Follow-Up: At [facility], you and your health needs are our priority.  As part of our continuing mission to provide you with exceptional heart care, we have created designated Provider Care Teams.  These Care Teams include your primary Cardiologist (physician) and Advanced Practice Providers (APPs -  Physician Assistants and Nurse Practitioners) who all work together to provide you with the care you need, when you need it.  We recommend signing up for the patient portal called "MyChart".  Sign up information is provided on this After Visit Summary.  MyChart is used to connect with patients for Virtual Visits (Telemedicine).  Patients are able to view lab/test results, encounter notes, upcoming appointments, etc.  Non-urgent messages can be sent to your provider as well.   To learn more about what you can do with MyChart, go to [email or web address].    Your next appointment:   6 month(s)  The format for your next appointment:   In Person  Provider:   Jenkins Rouge, MD      Important Information About Sugar

## 2022-02-06 ENCOUNTER — Other Ambulatory Visit: Payer: Self-pay | Admitting: Cardiovascular Disease

## 2022-02-15 ENCOUNTER — Other Ambulatory Visit: Payer: Self-pay | Admitting: Cardiovascular Disease

## 2022-06-02 ENCOUNTER — Encounter (HOSPITAL_COMMUNITY): Payer: Medicare Other

## 2022-06-06 ENCOUNTER — Ambulatory Visit (HOSPITAL_COMMUNITY)
Admission: RE | Admit: 2022-06-06 | Discharge: 2022-06-06 | Disposition: A | Discharge status: Home / Self Care | Payer: Medicare Other | Source: Ambulatory Visit | Attending: Cardiovascular Disease | Admitting: Cardiovascular Disease

## 2022-06-06 DIAGNOSIS — I25110 Atherosclerotic heart disease of native coronary artery with unstable angina pectoris: Secondary | ICD-10-CM | POA: Diagnosis present

## 2022-06-06 DIAGNOSIS — E782 Mixed hyperlipidemia: Secondary | ICD-10-CM | POA: Diagnosis not present

## 2022-06-06 MED ORDER — INCLISIRAN SODIUM 284 MG/1.5ML ~~LOC~~ SOSY
284.0000 mg | PREFILLED_SYRINGE | Freq: Once | SUBCUTANEOUS | Status: AC
  Administered 2022-06-06: 284 mg via SUBCUTANEOUS

## 2022-06-06 MED ORDER — INCLISIRAN SODIUM 284 MG/1.5ML ~~LOC~~ SOSY
PREFILLED_SYRINGE | SUBCUTANEOUS | Status: AC
  Filled 2022-06-06: qty 1.5

## 2022-08-16 LAB — LAB REPORT - SCANNED
A1c: 5.9
EGFR: 81.8

## 2022-09-13 NOTE — Progress Notes (Signed)
Cardiology Office Note    Date:  09/18/2022   ID:  Elloise Conley, DOB 1950-03-03, MRN [id number]  PCP:  Erskine Emery, NP  Cardiologist: Dr. Eden Emms  CC: follow up- 6 month.   History of Present Illness:   72 y.o. with remote Breast CA, diverticulitis with bowel resection Multiple interventions for CAD. Last cath 03/10/15 With patent stent in circumflex no significant disease in RCA and difficult intervention to tight mid/distal LAD Had subintimal dissection with no reflow ultimately got TIM I-3 flow     Has had liver failure with statins. Been on / off Repatha affordability issues 10/08/19 had some issues with erythema of injection site upper inner thigh Also complained of runny noes.  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 New pill with lactulose filler and bothering her stomach Toprol better.   Triglycerides > 400 labs in July 2024. Discussed Vascepa    Past Medical History:  Diagnosis Date   CAD (coronary artery disease)    a. a. S/p DES to LCX 12/2012. b. 02/2015: s/p successful but difficult PTCA to dLAD-1 c/b subintimal dissection requiring balloon inflations/DES, and PTCA to dLAD-2.   Cancer of right breast ([facility]) 1992   Diverticulitis    a. bowel resection in 10/2013.   Hyperglycemia    Hyperlipidemia    a. Statins caused to elevated LFTs.   Hypertension    NSTEMI (non-ST elevated myocardial infarction) ([facility]) 12/2012    Past Surgical History:  Procedure  Laterality Date   BREAST BIOPSY Right 1990   BREAST RECONSTRUCTION Right 1990   w/implant   CARDIAC CATHETERIZATION N/A 03/10/2015   Procedure: Left Heart Cath and Coronary Angiography;  Surgeon: Lennette Bihari, MD;  Location: [facility] INVASIVE CV LAB;  Service: Cardiovascular;  Laterality: N/A;   COLECTOMY  2015   "for diverticulitis"   CORONARY ANGIOPLASTY WITH STENT PLACEMENT  01/12/2013   LEFT HEART CATH N/A 01/12/2013   Procedure: LEFT HEART CATH;  Surgeon: Iran Ouch, MD;  Location: [facility] CATH LAB;  Service: Cardiovascular;  Laterality: N/A;   LIVER BIOPSY  2000s   Performed for abnormal LFTs in the setting of statin use   MASTECTOMY Bilateral 1992   PERCUTANEOUS CORONARY STENT INTERVENTION (PCI-S)  01/12/2013   Procedure: PERCUTANEOUS CORONARY STENT INTERVENTION (PCI-S);  Surgeon: Iran Ouch, MD;  Location: [facility] CATH LAB;  Service: Cardiovascular;;  Mid CX DES   PROCTOSCOPY N/A 10/27/2013   Procedure: RIGID PROCTOSCOPY;  Surgeon: Romie Levee, MD;  Location: [facility];  Service: General;  Laterality: N/A;   RECONSTRUCTION BREAST W/ TRAM FLAP Bilateral 1992   TONSILLECTOMY  1950s   TOTAL ABDOMINAL HYSTERECTOMY  2005    Current Medications: Outpatient Medications Prior to Visit  Medication Sig Dispense Refill   aspirin 81 MG chewable tablet Chew 81 mg by mouth daily.      Cholecalciferol 50 MCG (2000  UT) TABS Take by mouth.     Coenzyme Q10 (COQ-10) 100 MG capsule Take 100 mg by mouth every evening.      ezetimibe (ZETIA) 10 MG tablet TAKE 1 TABLET BY MOUTH EVERY DAY 90 tablet 3   inclisiran (LEQVIO) 284 MG/1.5ML SOSY injection Inject 284 mg into the skin every 6 (six) months.     isosorbide mononitrate (IMDUR) 30 MG 24 hr tablet TAKE 1/2 OF A TABLET (15 MG TOTAL) BY MOUTH DAILY 45 tablet 3   metoprolol succinate (TOPROL-XL) 50 MG 24 hr tablet Take 1 tablet (50 mg total) by mouth daily. Take with or immediately following a meal. 90 tablet 3   Multiple Vitamin (MULTIVITAMIN) tablet  Take 1 tablet by mouth daily.      nitroGLYCERIN (NITROSTAT) 0.4 MG SL tablet DISSOLVE ONE TABLET UNDER THE TONGUE EVERY 5 MINUTES AS NEEDED FOR CHEST PAIN.  DO NOT EXCEED A TOTAL OF 3 DOSES IN 15 MINUTES 25 tablet 6   No facility-administered medications prior to visit.     Allergies:   Egg-derived products, Statins, Sulfa antibiotics, Tilactase, and Ibuprofen   Social History   Socioeconomic History   Marital status: Married    Spouse name: Not on file   Number of children: Not on file   Years of education: Not on file   Highest education level: Not on file  Occupational History   Not on file  Tobacco Use   Smoking status: Never   Smokeless tobacco: Never  Substance and Sexual Activity   Alcohol use: No   Drug use: No   Sexual activity: Yes    Birth control/protection: Post-menopausal  Other Topics Concern   Not on file  Social History Narrative   Not on file   Social Determinants of Health   Financial Resource Strain: Not on file  Food Insecurity: Not on file  Transportation Needs: Not on file  Physical Activity: Not on file  Stress: Not on file  Social Connections: Not on file     Family History:  The patient's family history includes Healthy in her mother; Heart attack in her father, maternal grandfather, and paternal grandmother; Heart disease in her father.      ROS:   Please see the history of present illness.    ROS All other systems reviewed and are negative.   PHYSICAL EXAM:   VS:  BP 120/68   Pulse 65   Ht 5\' 2"  (1.575 m)   Wt 142 lb 6.4 oz (64.6 kg)   SpO2 97%   BMI 26.05 kg/m    Affect appropriate Healthy:  appears stated age HEENT: normal Neck supple with no adenopathy JVP normal no bruits no thyromegaly Lungs clear with no wheezing and good diaphragmatic motion Heart:  S1/S2 no murmur, no rub, gallop or click PMI normal Abdomen: benighn, BS positve, no tenderness, no AAA no bruit.  No HSM or HJR post lap choly  Distal pulses intact  with no bruits No edema Neuro non-focal Skin warm and dry Tattoo right forearm  No muscular weakness    Wt Readings from Last 3 Encounters:  09/18/22 142 lb 6.4 oz (64.6 kg)  01/26/22 138 lb 9.6 oz (62.9 kg)  07/22/21 140 lb (63.5 kg)      Studies/Labs Reviewed:   EKG:  09/18/2022 SR rate 84 normal   Recent Labs: No results found for requested labs within last 365 days.   Lipid Panel    Component Value Date/Time   CHOL 111  07/03/2017 0958   TRIG 173 (H) 07/03/2017 0958   HDL 45 07/03/2017 0958   CHOLHDL 2.5 07/03/2017 0958   CHOLHDL 6.1 03/10/2015 0324   VLDL 53 (H) 03/10/2015 0324   LDLCALC 31 07/03/2017 0958   LDLDIRECT 151 (H) 05/01/2017 1057    Additional studies/ records that were reviewed today include:  03/10/15 Left Heart Cath and Coronary Angiography  Conclusion  Prox RCA lesion, 30% stenosed. Mid RCA to Dist RCA lesion, 40% stenosed. Dist LAD-1 lesion, 85% stenosed. Post intervention, there is a 0% residual stenosis. The lesion was not previously treated. Dist LAD-2 lesion, 100% stenosed. Post intervention, there is a 0% residual stenosis. The left ventricular systolic function is normal.   Normal LV function.   Progressive native coronary obstructive disease with eccentric 85% somewhat calcified mid LAD stenosis; widely patent stent in the left circumflex coronary artery; and a dominant RCA with smooth 30% proximal and 30-40% narrowing in the region of the acute margin.   Difficult but ultimately successful percutaneous coronary intervention to the 85% mid LAD stenosis which was complicated by subintimal dissection resulting in no flow phenomena distally, chest pain, and transient ST segment elevation.  Ultimately after numerous balloon inflations, a 2.538 mm Xience Alpine DES stent was inserted in the mid LAD distally and PTCA alone was performed in the apical segment which resulted in restoration of TIMI-3 flow and residual narrowing of 0% at all  sites. RECOMMENDATION: Patient should continue dual antiplatelet therapy, probably indefinitely.     ASSESSMENT & PLAN:   CAD: continue ASA and BB. Dyspnea with Brilinta and pruritis headache with Effient continue plavix Prior stent to OM with difficult stent procedure to mid/distal LAD in 2017 stable Toprol with no dairy filler for pill    Essential HTN: Well controlled.  Continue current medications and low sodium Dash type diet.    HLD: F/U Pharm D now on Inclisiran ( Leqvio ) LDL 27 Triglycerides 433 labs done in July Start fibric acid 140 mg daily  will discuss with research if she would be a candidate for [employer] trial   Pre diabetes:Discussed low carb diet.  Target hemoglobin A1c is 6.5 or less.  Continue current medications.  Trial of Fibric acid Lipid /Liver in 3 months  F/U lipid clinic   F/U in a year    Charlton Haws

## 2022-09-15 ENCOUNTER — Telehealth: Payer: Self-pay

## 2022-09-15 NOTE — Telephone Encounter (Signed)
    Ethelda Chick, RN 08/21/2022 11:56 AM EDT Back to Top    Per Dr. Eden Emms, LDL direct is good. A1c is good . Triglycerides are high, low fat diet. Has she tried Vascepa ?   Called patient with Dr. Fabio Bering advisement of recent lab work. Patient stated her triglycerides have improved since her last lab work. Patient stated she has not tried vascepa, but can discuss is at office visit next month.    Wendall Stade, MD  Supple, Emeline Darling, RPH-CPP; Virl Axe, Rinaldo Cloud, RN Pam can you call in that script for fenofibrate   First message is from patient's lab work that was done on 08/16/22 with her PCP, Dr. Eden Emms wanted to try Vascepa. Patient stated she wanted to discuss at office visit this month. Will let patient discuss the fenofibrate with Dr. Eden Emms at her office visit on Monday.

## 2022-09-18 ENCOUNTER — Encounter: Payer: Self-pay | Admitting: Cardiovascular Disease

## 2022-09-18 ENCOUNTER — Ambulatory Visit: Payer: Medicare Other | Attending: Cardiovascular Disease | Admitting: Cardiovascular Disease

## 2022-09-18 VITALS — BP 120/68 | HR 65 | Ht 62.0 in | Wt 142.4 lb

## 2022-09-18 DIAGNOSIS — I10 Essential (primary) hypertension: Secondary | ICD-10-CM | POA: Diagnosis present

## 2022-09-18 DIAGNOSIS — E782 Mixed hyperlipidemia: Secondary | ICD-10-CM | POA: Insufficient documentation

## 2022-09-18 DIAGNOSIS — I25110 Atherosclerotic heart disease of native coronary artery with unstable angina pectoris: Secondary | ICD-10-CM | POA: Diagnosis present

## 2022-09-18 MED ORDER — FENOFIBRATE 145 MG PO TABS: 145.0000 mg | ORAL_TABLET | Freq: Every day | ORAL | 11 refills | Status: DC

## 2022-09-18 NOTE — Patient Instructions (Addendum)
Medication Instructions:  Your physician has recommended you make the following change in your medication:  1-START Fenofibrate 145 mg by mouth daily.  *If you need a refill on your cardiac medications before your next appointment, please call your pharmacy*  Lab Work: If you have labs (blood work) drawn today and your tests are completely normal, you will receive your results only by: MyChart Message (if you have MyChart) OR A paper copy in the mail If you have any lab test that is abnormal or we need to change your treatment, we will call you to review the results.  Testing/Procedures: None ordered today.  Follow-Up: At [facility], you and your health needs are our priority.  As part of our continuing mission to provide you with exceptional heart care, we have created designated Provider Care Teams.  These Care Teams include your primary Cardiologist (physician) and Advanced Practice Providers (APPs -  Physician Assistants and Nurse Practitioners) who all work together to provide you with the care you need, when you need it.  We recommend signing up for the patient portal called "MyChart".  Sign up information is provided on this After Visit Summary.  MyChart is used to connect with patients for Virtual Visits (Telemedicine).  Patients are able to view lab/test results, encounter notes, upcoming appointments, etc.  Non-urgent messages can be sent to your provider as well.   To learn more about what you can do with MyChart, go to [email or web address].    Your next appointment:   6 month(s)  Provider:   Charlton Haws, MD     Other Instructions Your physician recommends that you schedule a follow-up appointment in: 3 months with Lipid Clinic.

## 2022-09-19 ENCOUNTER — Telehealth: Payer: Self-pay | Admitting: Cardiovascular Disease

## 2022-09-19 MED ORDER — FENOFIBRATE 145 MG PO TABS: 145.0000 mg | ORAL_TABLET | Freq: Every day | ORAL | 3 refills | Status: AC

## 2022-09-19 NOTE — Telephone Encounter (Signed)
*  STAT* If patient is at the pharmacy, call can be transferred to refill team.   1. Which medications need to be refilled? (please list name of each medication and dose if known) fenofibrate (TRICOR) 145 MG tablet  2. Which pharmacy/location (including street and city if local pharmacy) is medication to be sent to? [id number] [place], [place] - [address]    3. Do they need a 30 day or 90 day supply? Patient states her prescription was sent to the incorrect pharmacy. Please transfer to the pharmacy listed above.

## 2022-09-19 NOTE — Telephone Encounter (Signed)
Pt's medication was sent to pt's pharmacy as requested. Confirmation received.  °

## 2022-11-01 ENCOUNTER — Other Ambulatory Visit: Payer: Self-pay | Admitting: Cardiovascular Disease

## 2022-11-24 ENCOUNTER — Encounter: Payer: Self-pay | Admitting: Pharmacist

## 2022-11-24 ENCOUNTER — Other Ambulatory Visit: Payer: Self-pay | Admitting: Pharmacist

## 2022-11-24 NOTE — Progress Notes (Signed)
Pt already on q 6 months. Switching from [facility]

## 2022-11-27 ENCOUNTER — Telehealth: Payer: Self-pay

## 2022-11-27 NOTE — Telephone Encounter (Signed)
Auth Submission: NO AUTH NEEDED Site of care: Site of care: [facility] Payer: Medicare A/B plus AARP supplement Medication & CPT/J Code(s) submitted: Leqvio (Inclisiran) [phone] Route of submission (phone, fax, portal):  Phone # Fax # Auth type: Buy/Bill PB Units/visits requested: 284mg  x 2 doses Reference number:  Approval from: 11/27/22 to 03/16/23   Medicare A/B will cover 80%, AARP will cover the remaining 20%.

## 2022-12-06 ENCOUNTER — Encounter (HOSPITAL_COMMUNITY): Payer: Medicare Other

## 2022-12-06 ENCOUNTER — Ambulatory Visit: Payer: Medicare Other

## 2022-12-06 VITALS — BP 114/72 | HR 57 | Temp 98.2°F | Resp 16 | Ht 62.0 in | Wt 141.8 lb

## 2022-12-06 DIAGNOSIS — I25110 Atherosclerotic heart disease of native coronary artery with unstable angina pectoris: Secondary | ICD-10-CM

## 2022-12-06 DIAGNOSIS — E782 Mixed hyperlipidemia: Secondary | ICD-10-CM

## 2022-12-06 MED ORDER — INCLISIRAN SODIUM 284 MG/1.5ML ~~LOC~~ SOSY
284.0000 mg | PREFILLED_SYRINGE | Freq: Once | SUBCUTANEOUS | Status: AC
  Administered 2022-12-06: 284 mg via SUBCUTANEOUS
  Filled 2022-12-06: qty 1.5

## 2022-12-06 NOTE — Progress Notes (Signed)
Diagnosis: Hyperlipidemia  Provider:  Chilton Greathouse MD  Procedure: Injection  Leqvio (inclisiran), Dose: 284 mg, Site: subcutaneous, Number of injections: 1  Administered in left arm.  Post Care: Patient declined observation  Discharge: Condition: Stable, Destination: Home . AVS Provided  Performed by:  Wyvonne Lenz, RN

## 2022-12-19 ENCOUNTER — Encounter: Payer: Self-pay | Admitting: Student

## 2022-12-19 ENCOUNTER — Ambulatory Visit: Payer: Medicare Other | Attending: Cardiovascular Disease | Admitting: Student

## 2022-12-19 DIAGNOSIS — E782 Mixed hyperlipidemia: Secondary | ICD-10-CM | POA: Insufficient documentation

## 2022-12-19 MED ORDER — ICOSAPENT ETHYL 1 G PO CAPS: 2.0000 g | ORAL_CAPSULE | Freq: Two times a day (BID) | ORAL | 11 refills | Status: DC

## 2022-12-19 NOTE — Patient Instructions (Signed)
Start taking Vascepa 2 gm twice daily. Follow up fasting lipid lab is due in 2-3 months

## 2022-12-19 NOTE — Assessment & Plan Note (Signed)
Assessment and Plan:  LDL goal < 55 mg dl last LDL unable to calculate due to elevated TG. Last TG 421 (08/2022) Intolerance to statins - affected liver, Repatha - runny nose and injection site reaction Current LDLc and TG lowering treatment - Leqvio, ezetimibe 10 mg and Tricor 145 mg daily - tolerates that well Discussed importance of lifestyle - heart healthy diet, regular exercise; hx -ve for smoking and [place] Dicussed benefit of Vascepa - patient is in agreement to start doing regular exercise ( cardio+ weight resistance) at least 150 min per week and cut down on carb and fat intake  Will start Vascepa 2 gm twice daily in addition to Tricor  Follow up lab in 8-12 weeks

## 2022-12-19 NOTE — Progress Notes (Signed)
Patient ID: Ashley Conley                 DOB: 1950-12-01                    MRN: [id number]      HPI: Ashley Conley is a 72 y.o. female patient referred to lipid clinic by Dr. Eden Emms. PMH is significant for hx of Breast CA, diverticulitis with bowel resection Multiple interventions for CAD, statin intolerance - liver failure   Patient was on Repatha before was not able to tolerate due to injection site reaction and runny nose. Tolerating Leqvio better. She went for lipid lab in July at her PCP her TG went above 400. She has been taking Tricor 145 mg daily. We discussed lifestyle in details. She eats mainly home cooked low salt low fat food. Reiterated lowering carb intake and fat intake. 20 min walks twice daily but no resistance exercise  Reviewed TG lowering options - Statins, fenofibrate and Vascepa, Lovaza.   Discussed mechanisms of action, dosing, side effects and potential decreases in TG.  Also reviewed cost information.  Current Medications: Leqvio 284 mg every 6 months, fenofibrate 145 mg daily and Zetia 10 mg daily  Intolerances: statins - affected liver, Repatha - runny nose and injection site reaction  Risk Factors: CAD, unstable angina, hx if NSTEMI  LDL goal: <55 mg/dl and TG goal <562   Diet:can't tolerate dairy and egg  Fish, [place] and chicken for protein and eats lots of vegetables/salads, likes parsnips,carrots   Drink-  Chia, herbal tea, coffee, and water   Exercise: walks 20 min daily but no resistance exercise    Family History:  Relation Problem Comments  Mother (Deceased) Healthy     Father (Deceased) Heart attack   Heart disease     Maternal Grandfather Heart attack     Paternal Grandmother Heart attack     Social History:  Alcohol: none  Smoking: never  Labs: Lipid Panel     Component Value Date/Time   CHOL 111 07/03/2017 0958   TRIG 173 (H) 07/03/2017 0958   HDL 45 07/03/2017 0958   CHOLHDL 2.5 07/03/2017 0958   CHOLHDL 6.1 03/10/2015 0324   VLDL  53 (H) 03/10/2015 0324   LDLCALC 31 07/03/2017 0958   LDLDIRECT 151 (H) 05/01/2017 1057   LABVLDL 35 07/03/2017 0958    Past Medical History:  Diagnosis Date   CAD (coronary artery disease)    a. a. S/p DES to LCX 12/2012. b. 02/2015: s/p successful but difficult PTCA to dLAD-1 c/b subintimal dissection requiring balloon inflations/DES, and PTCA to dLAD-2.   Cancer of right breast (HCC) 1992   Diverticulitis    a. bowel resection in 10/2013.   Hyperglycemia    Hyperlipidemia    a. Statins caused to elevated LFTs.   Hypertension    NSTEMI (non-ST elevated myocardial infarction) (HCC) 12/2012    Current Outpatient Medications on File Prior to Visit  Medication Sig Dispense Refill   aspirin 81 MG chewable tablet Chew 81 mg by mouth daily.      Cholecalciferol 50 MCG (2000 UT) TABS Take by mouth.     Coenzyme Q10 (COQ-10) 100 MG capsule Take 100 mg by mouth every evening.      ezetimibe (ZETIA) 10 MG tablet TAKE 1 TABLET BY MOUTH EVERY DAY 90 tablet 3   fenofibrate (TRICOR) 145 MG tablet Take 1 tablet (145 mg total) by mouth daily. 90 tablet 3   inclisiran (  LEQVIO) 284 MG/1.5ML SOSY injection Inject 284 mg into the skin every 6 (six) months.     isosorbide mononitrate (IMDUR) 30 MG 24 hr tablet TAKE 1/2 OF A TABLET (15 MG TOTAL) BY MOUTH DAILY 45 tablet 3   metoprolol succinate (TOPROL-XL) 50 MG 24 hr tablet Take 1 tablet (50 mg total) by mouth daily. Take with or immediately following a meal. 90 tablet 3   Multiple Vitamin (MULTIVITAMIN) tablet Take 1 tablet by mouth daily.      nitroGLYCERIN (NITROSTAT) 0.4 MG SL tablet DISSOLVE ONE TABLET UNDER THE TONGUE EVERY 5 MINUTES AS NEEDED FOR CHEST PAIN.  DO NOT EXCEED A TOTAL OF 3 DOSES IN 15 MINUTES 25 tablet 6   No current facility-administered medications on file prior to visit.    Allergies  Allergen Reactions   Egg-Derived Products Diarrhea   Statins Other (See Comments)    Liver failure from previously taking   Sulfa Antibiotics  Nausea And Vomiting   Tilactase Other (See Comments)    Lactose intolerant  Other Reaction(s): Other (See Comments)    Lactose intolerant   Ibuprofen Itching and Hives    Assessment/Plan:  1. Hyperlipidemia -  Problem  Hyperlipidemia   Statins caused to elevated LFTs  Current Medications: Leqvio 284 mg every 6 months, fenofibrate 145 mg daily and Zetia 10 mg daily  Intolerances: statins - affected liver, Repatha - runny nose and injection site reaction  Risk Factors: CAD, unstable angina, hx if NSTEMI  LDL goal: <55 mg/dl and TG goal <562     Hyperlipidemia Assessment and Plan:  LDL goal < 55 mg dl last LDL unable to calculate due to elevated TG. Last TG 421 (08/2022) Intolerance to statins - affected liver, Repatha - runny nose and injection site reaction Current LDLc and TG lowering treatment - Leqvio, ezetimibe 10 mg and Tricor 145 mg daily - tolerates that well Discussed importance of lifestyle - heart healthy diet, regular exercise; hx -ve for smoking and Et Oh Dicussed benefit of Vascepa - patient is in agreement to start doing regular exercise ( cardio+ weight resistance) at least 150 min per week and cut down on carb and fat intake  Will start Vascepa 2 gm twice daily in addition to Tricor  Follow up lab in 8-12 weeks     Thank you,  Carmela Hurt, Pharm.D [facility] A Division of [facility] [address], [place], [place] [address]  Phone: [phone]; Fax: [phone]

## 2023-01-16 ENCOUNTER — Other Ambulatory Visit: Payer: Self-pay | Admitting: Gastroenterology

## 2023-01-16 DIAGNOSIS — R748 Abnormal levels of other serum enzymes: Secondary | ICD-10-CM

## 2023-01-17 ENCOUNTER — Other Ambulatory Visit: Payer: Self-pay | Admitting: Cardiovascular Disease

## 2023-01-23 ENCOUNTER — Ambulatory Visit
Admission: RE | Admit: 2023-01-23 | Discharge: 2023-01-23 | Disposition: A | Discharge status: Home / Self Care | Payer: Medicare Other | Source: Ambulatory Visit | Attending: Gastroenterology | Admitting: Gastroenterology

## 2023-01-23 DIAGNOSIS — R748 Abnormal levels of other serum enzymes: Secondary | ICD-10-CM | POA: Insufficient documentation

## 2023-03-28 NOTE — Progress Notes (Signed)
Cardiology Office Note    Date:  04/06/2023   ID:  Ashley Conley, DOB 1951/01/26, MRN [id number]  PCP:  Erskine Emery, NP  Cardiologist: Dr. Eden Emms  CC: follow up- 6 month.   History of Present Illness:   73 y.o. with remote Breast CA, diverticulitis with bowel resection Multiple interventions for CAD. Last cath 03/10/15 With patent stent in circumflex no significant disease in RCA and difficult intervention to tight mid/distal LAD Had subintimal dissection with no reflow ultimately got TIM I-3 flow     Has had liver failure with statins. Been on / off Repatha affordability issues 10/08/19 had some issues with erythema of injection site upper inner thigh Also complained of runny noes.  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 New pill with lactulose filler and bothering her stomach Toprol better.   ? Unable to take Vascepa due to egg allergy   Past Medical History:  Diagnosis Date   CAD (coronary artery disease)    a. a. S/p DES to LCX 12/2012. b. 02/2015: s/p successful but difficult PTCA to dLAD-1 c/b subintimal dissection requiring balloon inflations/DES, and PTCA to dLAD-2.   Cancer of right breast (HCC) 1992   Diverticulitis    a. bowel resection in 10/2013.   Hyperglycemia    Hyperlipidemia    a. Statins caused to elevated LFTs.   Hypertension    NSTEMI (non-ST elevated myocardial infarction) (HCC) 12/2012    Past  Surgical History:  Procedure Laterality Date   BREAST BIOPSY Right 1990   BREAST RECONSTRUCTION Right 1990   w/implant   CARDIAC CATHETERIZATION N/A 03/10/2015   Procedure: Left Heart Cath and Coronary Angiography;  Surgeon: Lennette Bihari, MD;  Location: [facility] INVASIVE CV LAB;  Service: Cardiovascular;  Laterality: N/A;   COLECTOMY  2015   "for diverticulitis"   CORONARY ANGIOPLASTY WITH STENT PLACEMENT  01/12/2013   LEFT HEART CATH N/A 01/12/2013   Procedure: LEFT HEART CATH;  Surgeon: Iran Ouch, MD;  Location: [facility] CATH LAB;  Service: Cardiovascular;  Laterality: N/A;   LIVER BIOPSY  2000s   Performed for abnormal LFTs in the setting of statin use   MASTECTOMY Bilateral 1992   PERCUTANEOUS CORONARY STENT INTERVENTION (PCI-S)  01/12/2013   Procedure: PERCUTANEOUS CORONARY STENT INTERVENTION (PCI-S);  Surgeon: Iran Ouch, MD;  Location: [facility] CATH LAB;  Service: Cardiovascular;;  Mid CX DES   PROCTOSCOPY N/A 10/27/2013   Procedure: RIGID PROCTOSCOPY;  Surgeon: Romie Levee, MD;  Location: [facility] ORS;  Service: General;  Laterality: N/A;   RECONSTRUCTION BREAST W/ TRAM FLAP Bilateral 1992   TONSILLECTOMY  1950s   TOTAL ABDOMINAL HYSTERECTOMY  2005    Current Medications: Outpatient Medications Prior to Visit  Medication Sig Dispense Refill   aspirin 81 MG chewable tablet Chew 81 mg by mouth daily.  Coenzyme Q10 (COQ-10) 100 MG capsule Take 100 mg by mouth every evening.      ezetimibe (ZETIA) 10 MG tablet TAKE 1 TABLET BY MOUTH EVERY DAY 90 tablet 3   fenofibrate (TRICOR) 145 MG tablet Take 1 tablet (145 mg total) by mouth daily. 90 tablet 3   inclisiran (LEQVIO) 284 MG/1.5ML SOSY injection Inject 284 mg into the skin every 6 (six) months.     isosorbide mononitrate (IMDUR) 30 MG 24 hr tablet TAKE 1/2 OF A TABLET (15 MG TOTAL) BY MOUTH DAILY 45 tablet 3   metoprolol succinate (TOPROL-XL) 50 MG 24 hr tablet TAKE 1 TABLET BY MOUTH DAILY. TAKE WITH OR IMMEDIATELY FOLLOWING A MEAL.  90 tablet 2   nitroGLYCERIN (NITROSTAT) 0.4 MG SL tablet DISSOLVE ONE TABLET UNDER THE TONGUE EVERY 5 MINUTES AS NEEDED FOR CHEST PAIN.  DO NOT EXCEED A TOTAL OF 3 DOSES IN 15 MINUTES 25 tablet 6   Cholecalciferol 50 MCG (2000 UT) TABS Take by mouth.     icosapent Ethyl (VASCEPA) 1 g capsule Take 2 capsules (2 g total) by mouth 2 (two) times daily. 120 capsule 11   Multiple Vitamin (MULTIVITAMIN) tablet Take 1 tablet by mouth daily.      No facility-administered medications prior to visit.     Allergies:   Egg-derived products, Statins, Sulfa antibiotics, Tilactase, and Ibuprofen   Social History   Socioeconomic History   Marital status: Married    Spouse name: Not on file   Number of children: Not on file   Years of education: Not on file   Highest education level: Not on file  Occupational History   Not on file  Tobacco Use   Smoking status: Never   Smokeless tobacco: Never  Substance and Sexual Activity   Alcohol use: No   Drug use: No   Sexual activity: Yes    Birth control/protection: Post-menopausal  Other Topics Concern   Not on file  Social History Narrative   Not on file   Social Drivers of Health   Financial Resource Strain: Low Risk  (01/09/2023)   Received from [facility]   Overall Financial Resource Strain (CARDIA)    Difficulty of Paying Living Expenses: Not hard at all  Food Insecurity: Unknown (01/09/2023)   Received from [facility]   Hunger Vital Sign    Worried About Running Out of Food in the Last Year: Not on file    Ran Out of Food in the Last Year: Never true  Transportation Needs: No Transportation Needs (01/09/2023)   Received from [facility] - Transportation    In the past 12 months, has lack of transportation kept you from medical appointments or from getting medications?: No    Lack of Transportation (Non-Medical): No  Physical Activity: Not on file  Stress: Not on file   Social Connections: Not on file     Family History:  The patient's family history includes Healthy in her mother; Heart attack in her father, maternal grandfather, and paternal grandmother; Heart disease in her father.      ROS:   Please see the history of present illness.    ROS All other systems reviewed and are negative.   PHYSICAL EXAM:   VS:  BP 110/72   Pulse 63   Ht 5\' 2"  (1.575 m)   Wt 140 lb 9.6 oz (63.8 kg)   SpO2 96%   BMI 25.72 kg/m  Affect appropriate Healthy:  appears stated age HEENT: normal Neck supple with no adenopathy JVP normal no bruits no thyromegaly Lungs clear with no wheezing and good diaphragmatic motion Heart:  S1/S2 no murmur, no rub, gallop or click PMI normal Abdomen: benighn, BS positve, no tenderness, no AAA no bruit.  No HSM or HJR post lap choly  Distal pulses intact with no bruits No edema Neuro non-focal Skin warm and dry Tattoo right forearm  No muscular weakness    Wt Readings from Last 3 Encounters:  04/06/23 140 lb 9.6 oz (63.8 kg)  12/06/22 141 lb 12.8 oz (64.3 kg)  09/18/22 142 lb 6.4 oz (64.6 kg)      Studies/Labs Reviewed:   EKG:  04/06/2023 SR rate 84 normal   Recent Labs: No results found for requested labs within last 365 days.   Lipid Panel    Component Value Date/Time   CHOL 111 07/03/2017 0958   TRIG 173 (H) 07/03/2017 0958   HDL 45 07/03/2017 0958   CHOLHDL 2.5 07/03/2017 0958   CHOLHDL 6.1 03/10/2015 0324   VLDL 53 (H) 03/10/2015 0324   LDLCALC 31 07/03/2017 0958   LDLDIRECT 151 (H) 05/01/2017 1057    Additional studies/ records that were reviewed today include:  03/10/15 Left Heart Cath and Coronary Angiography  Conclusion  Prox RCA lesion, 30% stenosed. Mid RCA to Dist RCA lesion, 40% stenosed. Dist LAD-1 lesion, 85% stenosed. Post intervention, there is a 0% residual stenosis. The lesion was not previously treated. Dist LAD-2 lesion, 100% stenosed. Post intervention, there is a 0% residual  stenosis. The left ventricular systolic function is normal.   Normal LV function.   Progressive native coronary obstructive disease with eccentric 85% somewhat calcified mid LAD stenosis; widely patent stent in the left circumflex coronary artery; and a dominant RCA with smooth 30% proximal and 30-40% narrowing in the region of the acute margin.   Difficult but ultimately successful percutaneous coronary intervention to the 85% mid LAD stenosis which was complicated by subintimal dissection resulting in no flow phenomena distally, chest pain, and transient ST segment elevation.  Ultimately after numerous balloon inflations, a 2.538 mm Xience Alpine DES stent was inserted in the mid LAD distally and PTCA alone was performed in the apical segment which resulted in restoration of TIMI-3 flow and residual narrowing of 0% at all sites. RECOMMENDATION: Patient should continue dual antiplatelet therapy, probably indefinitely.     ASSESSMENT & PLAN:   CAD: continue ASA and BB. Dyspnea with Brilinta and pruritis headache with Effient continue plavix Prior stent to OM with difficult stent procedure to mid/distal LAD in 2017 stable Toprol with no dairy filler for pill    Essential HTN: Well controlled.  Continue current medications and low sodium Dash type diet.    HLD: F/U Pharm D now on Inclisiran ( Leqvio ) LDL 27 Triglycerides elevated and started on Vascepa 12/2022 ? Unable to take due to egg derived product allergy  Pre diabetes:Discussed low carb diet.  Target hemoglobin A1c is 6.5 or less.  Continue current medications.    F/U in a year    Charlton Haws

## 2023-04-06 ENCOUNTER — Ambulatory Visit: Payer: Medicare Other | Attending: Cardiovascular Disease | Admitting: Cardiovascular Disease

## 2023-04-06 ENCOUNTER — Encounter: Payer: Self-pay | Admitting: Cardiovascular Disease

## 2023-04-06 VITALS — BP 110/72 | HR 63 | Ht 62.0 in | Wt 140.6 lb

## 2023-04-06 DIAGNOSIS — I10 Essential (primary) hypertension: Secondary | ICD-10-CM

## 2023-04-06 DIAGNOSIS — E782 Mixed hyperlipidemia: Secondary | ICD-10-CM

## 2023-04-06 DIAGNOSIS — I25110 Atherosclerotic heart disease of native coronary artery with unstable angina pectoris: Secondary | ICD-10-CM | POA: Diagnosis not present

## 2023-04-06 NOTE — Patient Instructions (Signed)

## 2023-06-07 ENCOUNTER — Ambulatory Visit (INDEPENDENT_AMBULATORY_CARE_PROVIDER_SITE_OTHER): Payer: Medicare Other | Admitting: *Deleted

## 2023-06-07 VITALS — BP 125/72 | HR 62 | Temp 98.0°F | Resp 16 | Ht 62.0 in | Wt 141.6 lb

## 2023-06-07 DIAGNOSIS — I25110 Atherosclerotic heart disease of native coronary artery with unstable angina pectoris: Secondary | ICD-10-CM | POA: Diagnosis not present

## 2023-06-07 DIAGNOSIS — E782 Mixed hyperlipidemia: Secondary | ICD-10-CM

## 2023-06-07 MED ORDER — INCLISIRAN SODIUM 284 MG/1.5ML ~~LOC~~ SOSY
284.0000 mg | PREFILLED_SYRINGE | Freq: Once | SUBCUTANEOUS | Status: AC
  Administered 2023-06-07: 284 mg via SUBCUTANEOUS
  Filled 2023-06-07: qty 1.5

## 2023-06-07 NOTE — Progress Notes (Signed)
 Diagnosis: Hyperlipidemia  Provider:  Chilton Greathouse MD  Procedure: Injection  Leqvio (inclisiran), Dose: 284 mg, Site: subcutaneous, Number of injections: 1  Injection Site(s): Left arm  Post Care: Observation period completed  Discharge: Condition: Good, Destination: Home . AVS Provided  Performed by:  Forrest Moron, RN

## 2023-10-09 ENCOUNTER — Other Ambulatory Visit: Payer: Self-pay | Admitting: Medical Genetics

## 2023-10-17 ENCOUNTER — Other Ambulatory Visit: Payer: Self-pay

## 2023-10-17 MED ORDER — METOPROLOL SUCCINATE ER 50 MG PO TB24: 50.0000 mg | ORAL_TABLET | Freq: Every day | ORAL | 2 refills | Status: AC

## 2023-10-25 ENCOUNTER — Other Ambulatory Visit

## 2023-10-25 DIAGNOSIS — Z006 Encounter for examination for normal comparison and control in clinical research program: Secondary | ICD-10-CM

## 2023-11-02 LAB — GENECONNECT MOLECULAR SCREEN: Genetic Analysis Overall Interpretation: NEGATIVE

## 2023-12-11 ENCOUNTER — Ambulatory Visit (INDEPENDENT_AMBULATORY_CARE_PROVIDER_SITE_OTHER)

## 2023-12-11 VITALS — BP 151/80 | HR 63 | Temp 97.6°F | Resp 18 | Ht 62.0 in | Wt 143.2 lb

## 2023-12-11 DIAGNOSIS — E782 Mixed hyperlipidemia: Secondary | ICD-10-CM

## 2023-12-11 DIAGNOSIS — I25110 Atherosclerotic heart disease of native coronary artery with unstable angina pectoris: Secondary | ICD-10-CM | POA: Diagnosis not present

## 2023-12-11 MED ORDER — INCLISIRAN SODIUM 284 MG/1.5ML ~~LOC~~ SOSY
284.0000 mg | PREFILLED_SYRINGE | Freq: Once | SUBCUTANEOUS | Status: AC
  Administered 2023-12-11: 284 mg via SUBCUTANEOUS
  Filled 2023-12-11: qty 1.5

## 2023-12-11 NOTE — Progress Notes (Signed)
 Diagnosis: Hyperlipidemia  Provider:  Mannam, Praveen MD  Procedure: Injection  Leqvio  (inclisiran), Dose: 284 mg, Site: subcutaneous, Number of injections: 1  Injection Site(s): Left arm  Post Care: Patient declined observation  Discharge: Condition: Good, Destination: Home . AVS Provided  Performed by:  Leonard Feigel, RN

## 2024-01-16 ENCOUNTER — Other Ambulatory Visit: Payer: Self-pay | Admitting: Cardiovascular Disease

## 2024-01-22 MED ORDER — ISOSORBIDE MONONITRATE ER 30 MG PO TB24: 30.0000 mg | ORAL_TABLET | Freq: Every day | ORAL | 0 refills | Status: AC

## 2024-01-22 MED ORDER — EZETIMIBE 10 MG PO TABS: 10.0000 mg | ORAL_TABLET | Freq: Every day | ORAL | 0 refills | Status: AC

## 2024-02-22 ENCOUNTER — Telehealth: Payer: Self-pay

## 2024-02-22 NOTE — Telephone Encounter (Signed)
 Auth Submission: NO AUTH NEEDED Site of care: Site of care: [facility] Payer: Medicare A/B with AARP supplement Medication & CPT/J Code(s) submitted: Leqvio  (Inclisiran) J1306 Diagnosis Code:  Route of submission (phone, fax, portal):  Phone # Fax # Auth type: Buy/Bill PB Units/visits requested: 284mg  x 2 doses Reference number:  Approval from: 02/22/24 to 03/15/25

## 2024-04-11 ENCOUNTER — Ambulatory Visit: Admitting: Cardiovascular Disease

## 2024-06-12 ENCOUNTER — Ambulatory Visit
